# Patient Record
Sex: Female | Born: 1995 | Race: Black or African American | Hispanic: No | Marital: Single | Smoking: Current every day smoker
Health system: Southern US, Community
[De-identification: ages and names within clinical notes are randomized; demographics above are authoritative.]

## PROBLEM LIST (undated history)

## (undated) HISTORY — PX: NECK SURGERY: SHX720

---

## 2015-06-04 ENCOUNTER — Ambulatory Visit (HOSPITAL_COMMUNITY)
Admission: RE | Admit: 2015-06-04 | Discharge: 2015-06-04 | Disposition: A | Discharge status: Home / Self Care | Payer: Medicaid Other | Attending: Psychiatry | Admitting: Psychiatry

## 2015-06-04 DIAGNOSIS — F1290 Cannabis use, unspecified, uncomplicated: Secondary | ICD-10-CM | POA: Insufficient documentation

## 2015-06-04 DIAGNOSIS — F321 Major depressive disorder, single episode, moderate: Secondary | ICD-10-CM | POA: Insufficient documentation
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
## 2015-09-22 ENCOUNTER — Encounter (HOSPITAL_COMMUNITY): Payer: Self-pay | Admitting: *Deleted

## 2015-09-22 DIAGNOSIS — Y999 Unspecified external cause status: Secondary | ICD-10-CM | POA: Insufficient documentation

## 2015-09-22 DIAGNOSIS — Z9889 Other specified postprocedural states: Secondary | ICD-10-CM | POA: Insufficient documentation

## 2015-09-22 DIAGNOSIS — S161XXA Strain of muscle, fascia and tendon at neck level, initial encounter: Secondary | ICD-10-CM | POA: Insufficient documentation

## 2015-09-22 DIAGNOSIS — Y929 Unspecified place or not applicable: Secondary | ICD-10-CM | POA: Insufficient documentation

## 2015-09-22 DIAGNOSIS — Y939 Activity, unspecified: Secondary | ICD-10-CM | POA: Insufficient documentation

## 2015-09-22 DIAGNOSIS — X58XXXA Exposure to other specified factors, initial encounter: Secondary | ICD-10-CM | POA: Insufficient documentation

## 2015-09-22 DIAGNOSIS — J029 Acute pharyngitis, unspecified: Secondary | ICD-10-CM | POA: Insufficient documentation

## 2015-09-22 NOTE — ED Notes (Signed)
Patient presents with c/o neck pain - last neck surgery was in 2015.  Also c/o sore throat

## 2015-09-23 ENCOUNTER — Emergency Department (HOSPITAL_COMMUNITY)
Admission: EM | Admit: 2015-09-23 | Discharge: 2015-09-23 | Disposition: A | Discharge status: Home / Self Care | Payer: Self-pay | Attending: Emergency Medicine | Admitting: Emergency Medicine

## 2015-09-23 ENCOUNTER — Emergency Department (HOSPITAL_COMMUNITY): Payer: Self-pay

## 2015-09-23 DIAGNOSIS — S161XXA Strain of muscle, fascia and tendon at neck level, initial encounter: Secondary | ICD-10-CM

## 2015-09-23 DIAGNOSIS — J029 Acute pharyngitis, unspecified: Secondary | ICD-10-CM

## 2015-09-23 LAB — URINALYSIS, ROUTINE W REFLEX MICROSCOPIC
Bilirubin Urine: NEGATIVE
GLUCOSE, UA: NEGATIVE mg/dL
Ketones, ur: NEGATIVE mg/dL
LEUKOCYTES UA: NEGATIVE
Nitrite: NEGATIVE
PH: 6 (ref 5.0–8.0)
Protein, ur: NEGATIVE mg/dL
Specific Gravity, Urine: 1.013 (ref 1.005–1.030)

## 2015-09-23 LAB — URINE MICROSCOPIC-ADD ON: WBC, UA: NONE SEEN WBC/hpf (ref 0–5)

## 2015-09-23 LAB — RAPID STREP SCREEN (MED CTR MEBANE ONLY): Streptococcus, Group A Screen (Direct): NEGATIVE

## 2015-09-23 LAB — POC URINE PREG, ED: Preg Test, Ur: NEGATIVE

## 2015-09-23 MED ORDER — METHOCARBAMOL 500 MG PO TABS: 500.0000 mg | ORAL_TABLET | Freq: Four times a day (QID) | ORAL | Status: DC

## 2015-09-23 MED ORDER — ACETAMINOPHEN 160 MG/5ML PO LIQD: 640.0000 mg | ORAL | Status: DC | PRN

## 2015-09-23 NOTE — ED Notes (Signed)
Pt transported back to Room 8 via stretcher from xray. No signs of acute distress.

## 2015-09-23 NOTE — ED Provider Notes (Signed)
CSN: [id number]     Arrival date & time 09/22/15  2330 History   First MD Initiated Contact with Patient 09/23/15 0450     Chief Complaint  Patient presents with  . Neck Pain  . Post-op Problem     (Consider location/radiation/quality/duration/timing/severity/associated sxs/prior Treatment) HPI Comments: Patient with history of nontraumatic C-spine fracture [place] in [place] in 2014, needed repeat surgery after surgical fixation screws had broken in 2015 -- presents with complaint of 3 days of neck pain and sore throat. Neck pain began after sleeping on a hard floor. Pain is in her bilateral neck. No midline neck pain. Pain is worse with movement of her head. Patient reports having some tingling in right hand at onset but this resolved. No weakness.  Patient also has had sore throat, pain with swallowing. No fever or other URI symptoms. No known sick contacts. No difficulty breathing or handling secretions.    The history is provided by the patient.    History reviewed. No pertinent past medical history. Past Surgical History  Procedure Laterality Date  . Neck surgery     No family history on file. Social History  Substance Use Topics  . Smoking status: Never Smoker   . Smokeless tobacco: Never Used  . Alcohol Use: No   OB History    No data available     Review of Systems  Constitutional: Negative for fever, chills and fatigue.  HENT: Positive for sore throat. Negative for congestion, ear pain, rhinorrhea and sinus pressure.   Eyes: Negative for redness.  Respiratory: Negative for cough and wheezing.   Gastrointestinal: Negative for nausea, vomiting, abdominal pain and diarrhea.  Genitourinary: Negative for dysuria.  Musculoskeletal: Positive for neck pain. Negative for myalgias and neck stiffness.  Skin: Negative for rash.  Neurological: Negative for headaches.  Hematological: Negative for adenopathy.    Allergies  Banana and Eggs or egg-derived products  Home  Medications   Prior to Admission medications   Not on File   BP 107/70 mmHg  Pulse 83  Temp(Src) 98.7 F (37.1 C) (Oral)  Resp 18  Ht 5\' 2"  (1.575 m)  Wt 61.508 kg  BMI 24.80 kg/m2  SpO2 100%  LMP 08/17/2015   Physical Exam  Constitutional: She appears well-developed and well-nourished.  HENT:  Head: Normocephalic and atraumatic.  Right Ear: Tympanic membrane, external ear and ear canal normal.  Left Ear: Tympanic membrane, external ear and ear canal normal.  Nose: Nose normal. No mucosal edema or rhinorrhea.  Mouth/Throat: Uvula is midline and mucous membranes are normal. Mucous membranes are not dry. No oral lesions. No trismus in the jaw. No uvula swelling. Posterior oropharyngeal erythema present. No oropharyngeal exudate, posterior oropharyngeal edema or tonsillar abscesses.  No trismus.  Eyes: Conjunctivae are normal. Right eye exhibits no discharge. Left eye exhibits no discharge.  Neck: Normal range of motion. Neck supple.  Cardiovascular: Normal rate, regular rhythm and normal heart sounds.   Pulmonary/Chest: Effort normal and breath sounds normal. No respiratory distress. She has no wheezes. She has no rales.  Abdominal: Soft. There is no tenderness.  Musculoskeletal: She exhibits tenderness. She exhibits no edema.       Right shoulder: Normal.       Left shoulder: Normal.       Cervical back: She exhibits tenderness (Bilateral paraspinous muscular tenderness to palpation). She exhibits normal range of motion and no bony tenderness.       Thoracic back: She exhibits normal range of motion, no  tenderness and no bony tenderness.       Lumbar back: She exhibits normal range of motion, no tenderness and no bony tenderness.  Lymphadenopathy:    She has cervical adenopathy.  Neurological: She is alert.  Normal upper and lower extremity weakness.  Skin: Skin is warm and dry.  Psychiatric: She has a normal mood and affect.  Nursing note and vitals reviewed.   ED Course   Procedures (including critical care time) Labs Review Labs Reviewed  URINALYSIS, ROUTINE W REFLEX MICROSCOPIC (NOT AT [facility]) - Abnormal; Notable for the following:    APPearance CLOUDY (*)    Hgb urine dipstick MODERATE (*)    All other components within normal limits  URINE MICROSCOPIC-ADD ON - Abnormal; Notable for the following:    Squamous Epithelial / LPF 0-5 (*)    Bacteria, UA RARE (*)    All other components within normal limits  RAPID STREP SCREEN (NOT AT [facility])  CULTURE, GROUP A STREP ([facility])  POC URINE PREG, ED    Imaging Review Dg Cervical Spine Complete  09/23/2015  CLINICAL DATA:  Neck pain for 4 days after sleeping on a hard floor. History of multiple neck surgeries. EXAM: CERVICAL SPINE - COMPLETE 4+ VIEW COMPARISON:  None. FINDINGS: Postoperative changes with posterior rod and screw fixation of C1-C2. The inferior fixation screws are fractured. This could represent acute or chronic process as no prior comparison studies are available. A few segments appear intact. There appears to be complete bony fusion of C1-C2. Normal alignment of the cervical spine. No vertebral compression deformities. No prevertebral soft tissue swelling. No bone encroachment upon the neural foramina. No focal bone lesion or bone destruction. Soft tissues are unremarkable. IMPRESSION: Postoperative changes with posterior fusion and hardware fixation at C1-2. Fixation screws are fractured, age indeterminate. Bone fusion appears intact. Normal alignment of the cervical spine. No acute bony abnormalities are suggested. Electronically Signed   By: Burman Nieves M.D.   On: 09/23/2015 06:13   I have personally reviewed and evaluated these images and lab results as part of my medical decision-making.  5:30 AM Patient seen and examined. Work-up initiated.   Vital signs reviewed and are as follows: BP 107/70 mmHg  Pulse 83  Temp(Src) 98.7 F (37.1 C) (Oral)  Resp 18  Ht  (1.575 m)  Wt 61.508 kg   BMI 24.80 kg/m2  SpO2 100%  LMP 08/17/2015  7:10 AM Patient informed of x-ray results.  Discharge to home with Tylenol for sore throat and pain, Robaxin.  Patient counseled on proper use of muscle relaxant medication.  They were told not to drink alcohol, drive any vehicle, or do any dangerous activities while taking this medication.  Patient verbalized understanding.  Patient counseled on supportive care for pharyngitis and s/s to return including worsening symptoms, persistent fever, persistent vomiting, or if they have any other concerns. Urged to see PCP if symptoms persist for more than 3 days. Patient verbalizes understanding and agrees with plan.   MDM   Final diagnoses:  Pharyngitis  Cervical strain, initial encounter   Pharyngitis: Strep negative. Patient has pharyngeal erythema and mild cervical lymphadenopathy. No trismus. Full range of motion of neck. Low suspicion for deep space infection in neck. Likely viral.   Cervical strain: Patient with previous neck surgery. Previously known broken cervical fixation screws. Otherwise, hardware in place. Likely cervical strain from sleeping on the floor. No neurological deficits.   Renne Crigler, PA-C 09/23/15 0715  Gilda Crease, MD  09/24/15 0554 

## 2015-09-23 NOTE — ED Notes (Signed)
Patient transported to X-ray 

## 2015-09-23 NOTE — ED Notes (Signed)
Josh, PA at bedside to speak with pt regarding xray results and discharge instructions. Pt states understanding.

## 2015-09-23 NOTE — Discharge Instructions (Signed)
Please read and follow all provided instructions.  Your diagnoses today include:  1. Pharyngitis   2. Cervical strain, initial encounter     Tests performed today include:  Vital signs - see below for your results today  X-ray of neck - shows previously known broken screws  Strep test - negative  Medications prescribed:   Robaxin (methocarbamol) - muscle relaxer medication  DO NOT drive or perform any activities that require you to be awake and alert because this medicine can make you drowsy.   Take any prescribed medications only as directed.  Home care instructions:   Follow any educational materials contained in this packet  Please rest, use ice or heat on your neck for the next several days  Do not lift, push, pull anything more than 10 pounds for the next week  Follow-up instructions: Please follow-up with your primary care provider in the next 1 week for further evaluation of your symptoms.   Return instructions:  SEEK IMMEDIATE MEDICAL ATTENTION IF YOU HAVE:  New numbness, tingling, weakness, or problem with the use of your arms or legs  Severe back pain not relieved with medications  Loss control of your bowels or bladder  Increasing pain in any areas of the body (such as chest or abdominal pain)  Shortness of breath, dizziness, or fainting.   Worsening nausea (feeling sick to your stomach), vomiting, fever, or sweats  Trouble opening your mouth or trouble swallowing  Any other emergent concerns regarding your health   Additional Information:  Your vital signs today were: BP 120/84 mmHg   Pulse 83   Temp(Src) 98.7 F (37.1 C) (Oral)   Resp 18   Ht 5\' 2"  (1.575 m)   Wt 61.508 kg   BMI 24.80 kg/m2   SpO2 100%   LMP 08/17/2015 If your blood pressure (BP) was elevated above 135/85 this visit, please have this repeated by your doctor within one month. --------------

## 2015-09-25 LAB — CULTURE, GROUP A STREP (THRC)

## 2016-09-06 ENCOUNTER — Encounter (HOSPITAL_COMMUNITY): Payer: Self-pay | Admitting: Emergency Medicine

## 2016-09-06 ENCOUNTER — Emergency Department (HOSPITAL_COMMUNITY)
Admission: EM | Admit: 2016-09-06 | Discharge: 2016-09-06 | Disposition: A | Discharge status: Home / Self Care | Payer: Medicaid Other | Attending: Emergency Medicine | Admitting: Emergency Medicine

## 2016-09-06 DIAGNOSIS — B9689 Other specified bacterial agents as the cause of diseases classified elsewhere: Secondary | ICD-10-CM | POA: Diagnosis not present

## 2016-09-06 DIAGNOSIS — Z7982 Long term (current) use of aspirin: Secondary | ICD-10-CM | POA: Insufficient documentation

## 2016-09-06 DIAGNOSIS — R21 Rash and other nonspecific skin eruption: Secondary | ICD-10-CM | POA: Diagnosis not present

## 2016-09-06 DIAGNOSIS — N898 Other specified noninflammatory disorders of vagina: Secondary | ICD-10-CM | POA: Diagnosis present

## 2016-09-06 DIAGNOSIS — Z79899 Other long term (current) drug therapy: Secondary | ICD-10-CM | POA: Insufficient documentation

## 2016-09-06 DIAGNOSIS — N760 Acute vaginitis: Secondary | ICD-10-CM | POA: Diagnosis not present

## 2016-09-06 LAB — URINALYSIS, ROUTINE W REFLEX MICROSCOPIC
BACTERIA UA: NONE SEEN
Bilirubin Urine: NEGATIVE
Glucose, UA: NEGATIVE mg/dL
Ketones, ur: NEGATIVE mg/dL
LEUKOCYTES UA: NEGATIVE
Nitrite: NEGATIVE
PH: 6 (ref 5.0–8.0)
Protein, ur: NEGATIVE mg/dL
SPECIFIC GRAVITY, URINE: 1.01 (ref 1.005–1.030)

## 2016-09-06 LAB — WET PREP, GENITAL
SPERM: NONE SEEN
TRICH WET PREP: NONE SEEN
YEAST WET PREP: NONE SEEN

## 2016-09-06 MED ORDER — METRONIDAZOLE 500 MG PO TABS: 500.0000 mg | ORAL_TABLET | Freq: Two times a day (BID) | ORAL | 0 refills | Status: DC

## 2016-09-06 MED ORDER — CLOTRIMAZOLE-BETAMETHASONE 1-0.05 % EX CREA: TOPICAL_CREAM | CUTANEOUS | 0 refills | Status: DC

## 2016-09-06 MED ORDER — STERILE WATER FOR INJECTION IJ SOLN
INTRAMUSCULAR | Status: AC
  Filled 2016-09-06: qty 10

## 2016-09-06 MED ORDER — CEFTRIAXONE SODIUM 250 MG IJ SOLR
250.0000 mg | Freq: Once | INTRAMUSCULAR | Status: AC
  Administered 2016-09-06: 250 mg via INTRAMUSCULAR
  Filled 2016-09-06: qty 250

## 2016-09-06 MED ORDER — AZITHROMYCIN 250 MG PO TABS
1000.0000 mg | ORAL_TABLET | Freq: Once | ORAL | Status: AC
  Administered 2016-09-06: 1000 mg via ORAL
  Filled 2016-09-06: qty 4

## 2016-09-06 NOTE — Discharge Instructions (Signed)
Please take all of your antibiotics until finished! Ibuprofen as needed for pain. Use a condom with every sexual encounter Follow up with the OBGYN clinic listed in regards to today's visit. Please return to the ER for worsening symptoms, high fevers or persistent vomiting.  You have been tested for HIV, syphilis, chlamydia and gonorrhea. These results will be available in approximately 3 days. You will be notified if they are positive.   SEEK IMMEDIATE MEDICAL CARE IF:  You develop an oral temperature above 102 F (38.9 C), not controlled by medications or lasting more than 2 days.  You develop an increase in pain.  You develop vaginal bleeding and it is not time for your period.  You develop painful intercourse.

## 2016-09-06 NOTE — ED Notes (Signed)
With screening questions regarding SI pt verbalizes "at times" related to being overwhelmed; pt denies SI or plan at present time. Pt denies ever acting on SI in the past. When questioning pt if would like to be evaluated for SI pt denies and admittedly denies SI or evaluation for such complaint.

## 2016-09-06 NOTE — ED Notes (Signed)
Bed: [facility]1 Expected date:  Expected time:  Means of arrival:  Comments: 

## 2016-09-06 NOTE — ED Triage Notes (Signed)
Pt complaint of acute chronic lower back pain worsening for a few months. Pt continues to verbalize pelvic pain for a few days. Pt continues to verbalize generalized rash worsening over past few months. Pt continues to verbalize Implanon past due 2 years to be removed; denies symptoms related to Implanon. Pt denies n/v/d, GU symptoms, or vaginal odor/itching/abnormal discharge.

## 2016-09-06 NOTE — ED Provider Notes (Signed)
[facility]-EMERGENCY DEPT Provider Note   CSN: [id number] Arrival date & time: 09/06/16  [id number]     History   Chief Complaint Chief Complaint  Patient presents with  . Multiple Complaints    HPI Alexandra BalesVictoria Hughes is a 21 y.o. female.  The history is provided by the patient and medical records. No language interpreter was used.     Alexandra BalesVictoria Hughes is a 21 y.o. female who presents to the Emergency Department with multiple complaints:  1. Worsening pelvic pain x 4-5 days described as cramping and associated with vaginal discharge. Pain comes and goes. No dysuria or urinary urgency/frequency. No medications taken prior to arrival for symptoms. She denies any recent sexual activity in the last 6 months.   2. She would also like to be evaluated for her chronic back pain. She states this occurs daily and is constant for the last several months. It is worse with movement and lifting her young child. No acute change in her back pain. She has tried Tylenol with little relief.  3. Rash to bilateral legs which has been coming and going for several months as well. Itches. She has tried topical OTC itch cream. Hx of eczema but this seems different. Rash not present now.   4. Triage nursing staff made me aware that patient had answered "at times" to suicidal ideations, however to plan. When I asked patient if she felt suicidal, she says that she has been in the past, but not currently. She sees a Veterinary surgeoncounselor and follows up with them regularly. Never has had a plan for harming herself. No HI or auditory/visual hallucinations. Family and friends with her in the room and appear very supportive. No prior suicide attempts. No prior psychiatric illness diagnosis. No mental health meds.   History reviewed. No pertinent past medical history.  There are no active problems to display for this patient.   Past Surgical History:  Procedure Laterality Date  . NECK SURGERY      OB History    No data available        Home Medications    Prior to Admission medications   Medication Sig Start Date End Date Taking? Authorizing Provider  acetaminophen (TYLENOL) 160 MG/5ML liquid Take 20 mLs (640 mg total) by mouth every 4 (four) hours as needed for fever. 09/23/15  Yes Renne CriglerGeiple, Joshua, PA-C  Aspirin-Caffeine (BACK & BODY EXTRA STRENGTH) 500-32.5 MG TABS Take 2 tablets by mouth every 6 (six) hours as needed (pain).   Yes [provider]  traMADol (ULTRAM) 50 MG tablet Take 50 mg by mouth every 6 (six) hours as needed.   Yes [provider]  clotrimazole-betamethasone (LOTRISONE) cream Apply to affected area 2 times daily prn 09/06/16   Alisea Matte, Chase PicketJaime Pilcher, PA-C  methocarbamol (ROBAXIN) 500 MG tablet Take 1 tablet (500 mg total) by mouth 4 (four) times daily. Patient not taking: Reported on 09/06/2016 09/23/15   Renne CriglerGeiple, Joshua, PA-C  metroNIDAZOLE (FLAGYL) 500 MG tablet Take 1 tablet (500 mg total) by mouth 2 (two) times daily. 09/06/16   Edrian Melucci, Chase PicketJaime Pilcher, PA-C    Family History No family history on file.  Social History Social History  Substance Use Topics  . Smoking status: Never Smoker  . Smokeless tobacco: Never Used  . Alcohol use No     Allergies   Other; Banana; and Eggs or egg-derived products   Review of Systems Review of Systems  Gastrointestinal: Negative for abdominal pain, diarrhea, nausea and vomiting.  Genitourinary: Positive for  pelvic pain and vaginal discharge. Negative for dysuria.  Musculoskeletal: Positive for back pain. Negative for neck pain.  Skin: Positive for rash.  Psychiatric/Behavioral: Negative for agitation, self-injury and suicidal ideas. The patient is not nervous/anxious.   All other systems reviewed and are negative.    Physical Exam Updated Vital Signs BP (!) 150/124 (BP Location: Left Arm)   Pulse 75   Temp 98.1 F (36.7 C) (Oral)   Resp 17   Ht 5\' 2"  (1.575 m)   Wt 61.7 kg (136 lb)   LMP 08/10/2016   SpO2 97%   BMI 24.87  kg/m   Physical Exam  Constitutional: She is oriented to person, place, and time. She appears well-developed and well-nourished. No distress.  HENT:  Head: Normocephalic and atraumatic.  Cardiovascular: Normal rate, regular rhythm and normal heart sounds.   No murmur heard. Pulmonary/Chest: Effort normal and breath sounds normal. No respiratory distress.  Abdominal: Soft. Bowel sounds are normal. She exhibits no distension. There is no tenderness.  Genitourinary:  Genitourinary Comments: Chaperone present for exam. + discharge. No CMT. No adnexal masses, tenderness, or fullness.  No bleeding within vaginal vault  Musculoskeletal:  No midline C/T/L-spine tenderness. Full range of motion of the back. Straight leg raises are negative bilaterally. Able to ambulate with steady gait and without discomfort.  Neurological: She is alert and oriented to person, place, and time.  Bilateral lower extremities neurovascularly intact.  Skin: Skin is warm and dry.  No rash.  Nursing note and vitals reviewed.    ED Treatments / Results  Labs (all labs ordered are listed, but only abnormal results are displayed) Labs Reviewed  WET PREP, GENITAL - Abnormal; Notable for the following:       Result Value   Clue Cells Wet Prep HPF POC PRESENT (*)    WBC, Wet Prep HPF POC MANY (*)    All other components within normal limits  URINALYSIS, ROUTINE W REFLEX MICROSCOPIC - Abnormal; Notable for the following:    Hgb urine dipstick SMALL (*)    Squamous Epithelial / LPF 0-5 (*)    All other components within normal limits  RPR  HIV ANTIBODY (ROUTINE TESTING)  GC/CHLAMYDIA PROBE AMP ([place]) NOT AT [facility]    EKG  EKG Interpretation None       Radiology No results found.  Procedures Procedures (including critical care time)  Medications Ordered in ED Medications  sterile water (preservative free) injection (not administered)  cefTRIAXone (ROCEPHIN) injection 250 mg (250 mg Intramuscular  Given 09/06/16 1126)  azithromycin (ZITHROMAX) tablet 1,000 mg (1,000 mg Oral Given 09/06/16 1126)     Initial Impression / Assessment and Plan / ED Course  I have reviewed the triage vital signs and the nursing notes.  Pertinent labs & imaging results that were available during my care of the patient were reviewed by me and considered in my medical decision making (see chart for details).    Alexandra Hughes is a 21 y.o. female who presents to ED for Multiple complaints:  1. Pelvic pain and vaginal discharge. Urine negative. Wet prep with clue cells and many WBCs. No cervical or adnexal tenderness on exam. Afebrile. Prophylactically treated with azithromycin and Rocephin. Rx for Flagyl given. OB/GYN follow-up recommended and referral information provided.  2. Chronic low back pain. No red flag symptoms of back pain. No midline tenderness. Lower extremities neurovascularly intact. No concern for cauda equina. No infectious symptoms to suggest infectious etiology and. PCP follow-up. Tylenol or ibuprofen  as needed.  3. Intermittent rash. Currently not present. PCP follow-up.  4. When asked if she was suicidal in triage, patient stated "at times". When I questioned patient about SI, she states that she has Been suicidal in the past, but not currently. She has seen a counselor in the past and follows up with them regularly. She states that she has her phone number and calls frequently if she ever feels unsafe. She does not have any homicidal ideations or auditory/visual hallucinations. She never has had a suicide attempt in the past nor had a solid plan for harming herself. She is not on any mental health medications and has no prior psychiatric illness diagnosis. Her family and friends in the room and appeared very supportive. They are aware of occasional suicidal ideations and is in agreement that patient does indeed have follow-up care whom they trust. Prior to discharge, I spoke with patient privately  without family or friends in the room. She again denies any current suicidal thoughts. Offered TTS if she would like to speak to someone, but she again states that she can call her therapist if she needs anything. She understands that she can always return to ER if she has these thoughts and I encouraged her to do so. Other reasons to return to ER for problems #1-#3 were discussed and all questions answered.   Final Clinical Impressions(s) / ED Diagnoses   Final diagnoses:  Vaginal discharge  BV (bacterial vaginosis)  Rash    New Prescriptions Discharge Medication List as of 09/06/2016 11:24 AM    START taking these medications   Details  clotrimazole-betamethasone (LOTRISONE) cream Apply to affected area 2 times daily prn, Print    metroNIDAZOLE (FLAGYL) 500 MG tablet Take 1 tablet (500 mg total) by mouth 2 (two) times daily., Starting Mon 09/06/2016, Print         Alexandra Hughes, Chase Picket, PA-C 09/06/16 1241    Shaune Pollack, MD 09/07/16 [phone]

## 2016-09-06 NOTE — ED Notes (Signed)
ED Provider at bedside. 

## 2016-09-07 LAB — GC/CHLAMYDIA PROBE AMP (~~LOC~~) NOT AT ARMC
Chlamydia: NEGATIVE
Neisseria Gonorrhea: NEGATIVE

## 2016-09-07 LAB — HIV ANTIBODY (ROUTINE TESTING W REFLEX): HIV Screen 4th Generation wRfx: NONREACTIVE

## 2016-09-07 LAB — RPR: RPR Ser Ql: NONREACTIVE

## 2017-08-09 ENCOUNTER — Emergency Department (HOSPITAL_COMMUNITY): Payer: Medicaid Other

## 2017-08-09 ENCOUNTER — Emergency Department (HOSPITAL_COMMUNITY)
Admission: EM | Admit: 2017-08-09 | Discharge: 2017-08-09 | Disposition: A | Discharge status: Home / Self Care | Payer: Medicaid Other | Attending: Emergency Medicine | Admitting: Emergency Medicine

## 2017-08-09 ENCOUNTER — Encounter (HOSPITAL_COMMUNITY): Payer: Self-pay | Admitting: Emergency Medicine

## 2017-08-09 DIAGNOSIS — Z79899 Other long term (current) drug therapy: Secondary | ICD-10-CM | POA: Diagnosis not present

## 2017-08-09 DIAGNOSIS — Y999 Unspecified external cause status: Secondary | ICD-10-CM | POA: Diagnosis not present

## 2017-08-09 DIAGNOSIS — F17290 Nicotine dependence, other tobacco product, uncomplicated: Secondary | ICD-10-CM | POA: Diagnosis not present

## 2017-08-09 DIAGNOSIS — S0083XA Contusion of other part of head, initial encounter: Secondary | ICD-10-CM | POA: Diagnosis not present

## 2017-08-09 DIAGNOSIS — Y939 Activity, unspecified: Secondary | ICD-10-CM | POA: Diagnosis not present

## 2017-08-09 DIAGNOSIS — S80212A Abrasion, left knee, initial encounter: Secondary | ICD-10-CM | POA: Diagnosis not present

## 2017-08-09 DIAGNOSIS — Y929 Unspecified place or not applicable: Secondary | ICD-10-CM | POA: Insufficient documentation

## 2017-08-09 DIAGNOSIS — T07XXXA Unspecified multiple injuries, initial encounter: Secondary | ICD-10-CM

## 2017-08-09 DIAGNOSIS — S0990XA Unspecified injury of head, initial encounter: Secondary | ICD-10-CM | POA: Diagnosis present

## 2017-08-09 LAB — POC URINE PREG, ED: PREG TEST UR: NEGATIVE

## 2017-08-09 MED ORDER — NAPROXEN 375 MG PO TABS: 375.0000 mg | ORAL_TABLET | Freq: Two times a day (BID) | ORAL | 0 refills | Status: DC

## 2017-08-09 MED ORDER — CYCLOBENZAPRINE HCL 10 MG PO TABS: 10.0000 mg | ORAL_TABLET | Freq: Two times a day (BID) | ORAL | 0 refills | Status: DC | PRN

## 2017-08-09 NOTE — ED Notes (Signed)
Bed: WTR6 Expected date:  Expected time:  Means of arrival:  Comments: 
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
## 2017-08-09 NOTE — Discharge Instructions (Addendum)
Do not drive while taking the muscle relaxer as it will make you sleepy. °

## 2017-11-09 ENCOUNTER — Other Ambulatory Visit: Payer: Self-pay

## 2017-11-09 ENCOUNTER — Encounter (HOSPITAL_BASED_OUTPATIENT_CLINIC_OR_DEPARTMENT_OTHER): Payer: Self-pay | Admitting: Emergency Medicine

## 2017-11-09 ENCOUNTER — Emergency Department (HOSPITAL_BASED_OUTPATIENT_CLINIC_OR_DEPARTMENT_OTHER)
Admission: EM | Admit: 2017-11-09 | Discharge: 2017-11-09 | Disposition: A | Discharge status: Home / Self Care | Payer: Medicaid Other | Attending: Emergency Medicine | Admitting: Emergency Medicine

## 2017-11-09 DIAGNOSIS — N760 Acute vaginitis: Secondary | ICD-10-CM | POA: Insufficient documentation

## 2017-11-09 DIAGNOSIS — F17200 Nicotine dependence, unspecified, uncomplicated: Secondary | ICD-10-CM | POA: Diagnosis not present

## 2017-11-09 DIAGNOSIS — Z202 Contact with and (suspected) exposure to infections with a predominantly sexual mode of transmission: Secondary | ICD-10-CM | POA: Diagnosis not present

## 2017-11-09 DIAGNOSIS — M7918 Myalgia, other site: Secondary | ICD-10-CM

## 2017-11-09 DIAGNOSIS — B9689 Other specified bacterial agents as the cause of diseases classified elsewhere: Secondary | ICD-10-CM

## 2017-11-09 LAB — URINALYSIS, ROUTINE W REFLEX MICROSCOPIC
BILIRUBIN URINE: NEGATIVE
GLUCOSE, UA: NEGATIVE mg/dL
KETONES UR: NEGATIVE mg/dL
Leukocytes, UA: NEGATIVE
Nitrite: NEGATIVE
PROTEIN: NEGATIVE mg/dL
Specific Gravity, Urine: 1.015 (ref 1.005–1.030)
pH: 6.5 (ref 5.0–8.0)

## 2017-11-09 LAB — PREGNANCY, URINE: PREG TEST UR: NEGATIVE

## 2017-11-09 LAB — WET PREP, GENITAL
Sperm: NONE SEEN
TRICH WET PREP: NONE SEEN
Yeast Wet Prep HPF POC: NONE SEEN

## 2017-11-09 LAB — URINALYSIS, MICROSCOPIC (REFLEX): WBC, UA: NONE SEEN WBC/hpf (ref 0–5)

## 2017-11-09 MED ORDER — METHOCARBAMOL 500 MG PO TABS: 500.0000 mg | ORAL_TABLET | Freq: Two times a day (BID) | ORAL | 0 refills | Status: DC

## 2017-11-09 MED ORDER — IBUPROFEN 400 MG PO TABS: 400.0000 mg | ORAL_TABLET | Freq: Four times a day (QID) | ORAL | 0 refills | Status: DC | PRN

## 2017-11-09 MED ORDER — CEFTRIAXONE SODIUM 250 MG IJ SOLR
250.0000 mg | Freq: Once | INTRAMUSCULAR | Status: AC
  Administered 2017-11-09: 250 mg via INTRAMUSCULAR
  Filled 2017-11-09: qty 250

## 2017-11-09 MED ORDER — METRONIDAZOLE 500 MG PO TABS: 500.0000 mg | ORAL_TABLET | Freq: Two times a day (BID) | ORAL | 0 refills | Status: DC

## 2017-11-09 MED ORDER — AZITHROMYCIN 250 MG PO TABS
1000.0000 mg | ORAL_TABLET | Freq: Once | ORAL | Status: AC
  Administered 2017-11-09: 1000 mg via ORAL
  Filled 2017-11-09: qty 4

## 2017-11-09 MED ORDER — LIDOCAINE HCL (PF) 1 % IJ SOLN
INTRAMUSCULAR | Status: AC
  Administered 2017-11-09: 2.5 mL
  Filled 2017-11-09: qty 5
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
## 2017-11-09 NOTE — Discharge Instructions (Addendum)
Please follow-up with your primary care provider for further evaluation regarding your visit today. Please return to the emergency department for any new or worsening symptoms. Please take all of your antibiotic medication, Flagyl, as prescribed.  Please do not drink alcohol while taking this medication, it will make you vomit. You may take the muscle relaxer, Robaxin, as prescribed.  Please do not drive or operate heavy machinery while taking this medication because it would make you drowsy. You may also take the anti-inflammatory medication, ibuprofen, as prescribed.  Please be sure to drink plenty of water to avoid dehydration.  Contact a health care provider if: Your muscle pain gets worse and medicines do not help. You have muscle pain that lasts longer than 3 days. You have a rash or fever along with muscle pain. You have muscle pain after a tick bite. You have muscle pain while working out, even though you are in good physical condition. You have redness, soreness, or swelling along with muscle pain. You have muscle pain after starting a new medicine or changing the dose of a medicine. Get help right away if: You have trouble breathing. You have trouble swallowing. You have muscle pain along with a stiff neck, fever, and vomiting. You have severe muscle weakness or cannot move part of your body. Contact a health care provider if: See your health care provider. Tell your sexual partner(s). They should be tested and treated for any STDs. Do not have sex until your health care provider says it is okay. Get help right away if: You have severe abdominal pain. You are a man and notice swelling or pain in your testicles. You are a woman and notice swelling or pain in your vagina.

## 2017-11-10 LAB — RPR: RPR: NONREACTIVE

## 2017-11-10 LAB — HIV ANTIBODY (ROUTINE TESTING W REFLEX): HIV SCREEN 4TH GENERATION: NONREACTIVE

## 2017-11-11 LAB — GC/CHLAMYDIA PROBE AMP (~~LOC~~) NOT AT ARMC
Chlamydia: NEGATIVE
NEISSERIA GONORRHEA: NEGATIVE

## 2018-03-06 ENCOUNTER — Emergency Department (HOSPITAL_COMMUNITY): Payer: Medicaid Other

## 2018-03-06 ENCOUNTER — Other Ambulatory Visit: Payer: Self-pay

## 2018-03-06 ENCOUNTER — Emergency Department (HOSPITAL_COMMUNITY)
Admission: EM | Admit: 2018-03-06 | Discharge: 2018-03-06 | Disposition: A | Discharge status: Home / Self Care | Payer: Medicaid Other | Attending: Emergency Medicine | Admitting: Emergency Medicine

## 2018-03-06 DIAGNOSIS — Z79899 Other long term (current) drug therapy: Secondary | ICD-10-CM | POA: Insufficient documentation

## 2018-03-06 DIAGNOSIS — F17290 Nicotine dependence, other tobacco product, uncomplicated: Secondary | ICD-10-CM | POA: Insufficient documentation

## 2018-03-06 DIAGNOSIS — M542 Cervicalgia: Secondary | ICD-10-CM | POA: Insufficient documentation

## 2018-03-06 MED ORDER — DIAZEPAM 5 MG PO TABS: 5.0000 mg | ORAL_TABLET | Freq: Four times a day (QID) | ORAL | 0 refills | Status: DC | PRN

## 2018-03-06 MED ORDER — KETOROLAC TROMETHAMINE 60 MG/2ML IM SOLN
60.0000 mg | Freq: Once | INTRAMUSCULAR | Status: AC
  Administered 2018-03-06: 60 mg via INTRAMUSCULAR
  Filled 2018-03-06: qty 2

## 2018-03-06 MED ORDER — DIAZEPAM 2 MG PO TABS
2.0000 mg | ORAL_TABLET | Freq: Once | ORAL | Status: DC
  Filled 2018-03-06: qty 1

## 2018-03-06 MED ORDER — IBUPROFEN 800 MG PO TABS: 800.0000 mg | ORAL_TABLET | Freq: Three times a day (TID) | ORAL | 0 refills | Status: DC

## 2018-03-06 NOTE — ED Provider Notes (Signed)
[facility]-EMERGENCY DEPT Provider Note   CSN: [id number] Arrival date & time: 03/06/18  0751     History   Chief Complaint Chief Complaint  Patient presents with  . Neck Pain    HPI Alexandra Hughes is a 22 y.o. female.  The history is provided by the patient.  Neck Pain   This is a recurrent problem. The current episode started more than 2 days ago. The problem occurs constantly. The problem has been gradually worsening. The pain is associated with nothing. There has been no fever. The pain is present in the left side and right side. The pain is moderate. The symptoms are aggravated by twisting, bending and position.    No past medical history on file.  There are no active problems to display for this patient.   Past Surgical History:  Procedure Laterality Date  . NECK SURGERY       OB History   None      Home Medications    Prior to Admission medications   Medication Sig Start Date End Date Taking? Authorizing Provider  acetaminophen (TYLENOL) 160 MG/5ML liquid Take 20 mLs (640 mg total) by mouth every 4 (four) hours as needed for fever. 09/23/15   Renne CriglerGeiple, Joshua, PA-C  Aspirin-Caffeine (BACK & BODY EXTRA STRENGTH) 500-32.5 MG TABS Take 2 tablets by mouth every 6 (six) hours as needed (pain).    [provider]  cyclobenzaprine (FLEXERIL) 10 MG tablet Take 1 tablet (10 mg total) by mouth 2 (two) times daily as needed for muscle spasms. 08/09/17   Janne NapoleonNeese, Hope M, NP  diazepam (VALIUM) 5 MG tablet Take 1 tablet (5 mg total) by mouth every 6 (six) hours as needed for anxiety (spasms). 03/06/18   Skyy Mcknight, Barbara CowerJason, MD  ibuprofen (ADVIL,MOTRIN) 800 MG tablet Take 1 tablet (800 mg total) by mouth 3 (three) times daily. 03/06/18   Adelbert Gaspard, Barbara CowerJason, MD  methocarbamol (ROBAXIN) 500 MG tablet Take 1 tablet (500 mg total) by mouth 2 (two) times daily. 11/09/17   Harlene SaltsMorelli, Brandon A, PA-C  metroNIDAZOLE (FLAGYL) 500 MG tablet Take 1 tablet (500 mg total) by  mouth 2 (two) times daily. 11/09/17   Harlene SaltsMorelli, Brandon A, PA-C  naproxen (NAPROSYN) 375 MG tablet Take 1 tablet (375 mg total) by mouth 2 (two) times daily. 08/09/17   Janne NapoleonNeese, Hope M, NP    Family History No family history on file.  Social History Social History   Tobacco Use  . Smoking status: Current Every Day Smoker    Types: Cigars  . Smokeless tobacco: Never Used  Substance Use Topics  . Alcohol use: No  . Drug use: Yes    Frequency: 1.0 times per week    Types: Marijuana     Allergies   Other; Banana; and Eggs or egg-derived products   Review of Systems Review of Systems  Musculoskeletal: Positive for neck pain.  All other systems reviewed and are negative.    Physical Exam Updated Vital Signs BP 121/72 (BP Location: Right Arm)   Pulse 74   Temp 98.2 F (36.8 C) (Oral)   Resp 20   Ht 5\' 2"  (1.575 m)   Wt 73.1 kg   LMP 01/25/2018 (Approximate)   SpO2 100%   BMI 29.49 kg/m   Physical Exam  Constitutional: She is oriented to person, place, and time. She appears well-developed and well-nourished.  HENT:  Head: Normocephalic and atraumatic.  Eyes: Conjunctivae and EOM are normal.  Neck: Normal range of motion.  Cardiovascular: Normal rate and regular rhythm.  Pulmonary/Chest: No stridor. No respiratory distress.  Abdominal: Soft. Bowel sounds are normal. She exhibits no distension.  Musculoskeletal: Normal range of motion. She exhibits tenderness (to bilateral trapezius in neck and upper back).  Neurological: She is alert and oriented to person, place, and time. She displays normal reflexes. No cranial nerve deficit. Coordination normal.  Skin: Skin is warm and dry.  Nursing note and vitals reviewed.    ED Treatments / Results  Labs (all labs ordered are listed, but only abnormal results are displayed) Labs Reviewed - No data to display  EKG None  Radiology Dg Cervical Spine Complete  Result Date: 03/06/2018 CLINICAL DATA:  Posterior neck pain.  EXAM: CERVICAL SPINE - COMPLETE 4+ VIEW COMPARISON:  08/09/2017 FINDINGS: Posterior fusion changes at C1 tube. The C2 screws are fractured, stable since prior study. No acute bony abnormality. Normal alignment. Disc spaces maintained. IMPRESSION: Posterior fusion changes at C1-2. Fractured C2 screws, stable. No acute bony abnormality. Electronically Signed   By: Charlett Nose M.D.   On: 03/06/2018 09:08    Procedures Procedures (including critical care time)  Medications Ordered in ED Medications  diazepam (VALIUM) tablet 2 mg (0 mg Oral Hold 03/06/18 0905)  ketorolac (TORADOL) injection 60 mg (60 mg Intramuscular Given 03/06/18 0848)     Initial Impression / Assessment and Plan / ED Course  I have reviewed the triage vital signs and the nursing notes.  Pertinent labs & imaging results that were available during my care of the patient were reviewed by me and considered in my medical decision making (see chart for details).     Patient with likely trapezius sprain.  X-ray done per patient request showed stability of cervical spine and known broken screws.  Patient will give prescription for muscle relaxers and encouraged to follow-up with her neck surgeon.  Final Clinical Impressions(s) / ED Diagnoses   Final diagnoses:  Neck pain    ED Discharge Orders         Ordered    diazepam (VALIUM) 5 MG tablet  Every 6 hours PRN     03/06/18 0928    ibuprofen (ADVIL,MOTRIN) 800 MG tablet  3 times daily     03/06/18 0928           Panda Crossin, Barbara Cower, MD 03/06/18 [id number]

## 2018-03-06 NOTE — ED Triage Notes (Signed)
Patient c/o bilateral and posterior neck pain x 3 days which has gotten progressively worse. Patient denies any injury. MAE.

## 2018-11-05 ENCOUNTER — Emergency Department (HOSPITAL_COMMUNITY)
Admission: EM | Admit: 2018-11-05 | Discharge: 2018-11-05 | Disposition: A | Discharge status: Home / Self Care | Payer: Medicaid Other | Attending: Emergency Medicine | Admitting: Emergency Medicine

## 2018-11-05 ENCOUNTER — Other Ambulatory Visit: Payer: Self-pay

## 2018-11-05 ENCOUNTER — Encounter (HOSPITAL_COMMUNITY): Payer: Self-pay

## 2018-11-05 ENCOUNTER — Emergency Department (HOSPITAL_COMMUNITY): Payer: Medicaid Other

## 2018-11-05 DIAGNOSIS — E282 Polycystic ovarian syndrome: Secondary | ICD-10-CM | POA: Diagnosis not present

## 2018-11-05 DIAGNOSIS — B379 Candidiasis, unspecified: Secondary | ICD-10-CM

## 2018-11-05 DIAGNOSIS — F17210 Nicotine dependence, cigarettes, uncomplicated: Secondary | ICD-10-CM | POA: Insufficient documentation

## 2018-11-05 DIAGNOSIS — R1030 Lower abdominal pain, unspecified: Secondary | ICD-10-CM | POA: Diagnosis present

## 2018-11-05 DIAGNOSIS — R102 Pelvic and perineal pain: Secondary | ICD-10-CM | POA: Diagnosis not present

## 2018-11-05 DIAGNOSIS — N760 Acute vaginitis: Secondary | ICD-10-CM | POA: Diagnosis not present

## 2018-11-05 DIAGNOSIS — B9689 Other specified bacterial agents as the cause of diseases classified elsewhere: Secondary | ICD-10-CM

## 2018-11-05 LAB — COMPREHENSIVE METABOLIC PANEL
ALT: 22 U/L (ref 0–44)
AST: 22 U/L (ref 15–41)
Albumin: 4 g/dL (ref 3.5–5.0)
Alkaline Phosphatase: 63 U/L (ref 38–126)
Anion gap: 8 (ref 5–15)
BUN: 10 mg/dL (ref 6–20)
CO2: 25 mmol/L (ref 22–32)
Calcium: 8.9 mg/dL (ref 8.9–10.3)
Chloride: 107 mmol/L (ref 98–111)
Creatinine, Ser: 0.65 mg/dL (ref 0.44–1.00)
GFR calc Af Amer: >60 mL/min (ref >60)
GFR calc non Af Amer: >60 mL/min (ref >60)
Glucose, Bld: 93 mg/dL (ref 70–99)
Potassium: 4 mmol/L (ref 3.5–5.1)
Sodium: 140 mmol/L (ref 135–145)
Total Bilirubin: 0.3 mg/dL (ref 0.3–1.2)
Total Protein: 7.6 g/dL (ref 6.5–8.1)

## 2018-11-05 LAB — URINALYSIS, ROUTINE W REFLEX MICROSCOPIC
Bilirubin Urine: NEGATIVE
Glucose, UA: NEGATIVE mg/dL
Hgb urine dipstick: NEGATIVE
Ketones, ur: NEGATIVE mg/dL
Leukocytes,Ua: NEGATIVE
Nitrite: NEGATIVE
Protein, ur: NEGATIVE mg/dL
Specific Gravity, Urine: 1.024 (ref 1.005–1.030)
pH: 6 (ref 5.0–8.0)

## 2018-11-05 LAB — CBC WITH DIFFERENTIAL/PLATELET
Abs Immature Granulocytes: 0.05 10*3/uL (ref 0.00–0.07)
Basophils Absolute: 0 10*3/uL (ref 0.0–0.1)
Basophils Relative: 0 %
Eosinophils Absolute: 0.2 10*3/uL (ref 0.0–0.5)
Eosinophils Relative: 2 %
HCT: 38 % (ref 36.0–46.0)
Hemoglobin: 12 g/dL (ref 12.0–15.0)
Immature Granulocytes: 1 %
Lymphocytes Relative: 19 %
Lymphs Abs: 2.1 10*3/uL (ref 0.7–4.0)
MCH: 29.3 pg (ref 26.0–34.0)
MCHC: 31.6 g/dL (ref 30.0–36.0)
MCV: 92.9 fL (ref 80.0–100.0)
Monocytes Absolute: 0.6 10*3/uL (ref 0.1–1.0)
Monocytes Relative: 6 %
Neutro Abs: 8 10*3/uL — ABNORMAL HIGH (ref 1.7–7.7)
Neutrophils Relative %: 72 %
Platelets: 235 10*3/uL (ref 150–400)
RBC: 4.09 MIL/uL (ref 3.87–5.11)
RDW: 13.7 % (ref 11.5–15.5)
WBC: 11 10*3/uL — ABNORMAL HIGH (ref 4.0–10.5)
nRBC: 0 % (ref 0.0–0.2)

## 2018-11-05 LAB — LIPASE, BLOOD: Lipase: 31 U/L (ref 11–51)

## 2018-11-05 LAB — PREGNANCY, URINE: Preg Test, Ur: NEGATIVE

## 2018-11-05 LAB — HIV ANTIBODY (ROUTINE TESTING W REFLEX): HIV Screen 4th Generation wRfx: NONREACTIVE

## 2018-11-05 LAB — WET PREP, GENITAL
Sperm: NONE SEEN
Trich, Wet Prep: NONE SEEN

## 2018-11-05 LAB — RPR: RPR Ser Ql: NONREACTIVE

## 2018-11-05 MED ORDER — FLUCONAZOLE 150 MG PO TABS: 150.0000 mg | ORAL_TABLET | Freq: Once | ORAL | 0 refills | Status: AC

## 2018-11-05 MED ORDER — METRONIDAZOLE 500 MG PO TABS: 500.0000 mg | ORAL_TABLET | Freq: Two times a day (BID) | ORAL | 0 refills | Status: DC

## 2018-11-05 NOTE — Discharge Instructions (Addendum)
Please take Ibuprofen (Advil, motrin) and Tylenol (acetaminophen) to relieve your pain.  You may take up to 600 MG (3 pills) of normal strength ibuprofen every 8 hours as needed.  In between doses of ibuprofen you make take tylenol, up to 1,000 mg (two extra strength pills).  Do not take more than 3,000 mg tylenol in a 24 hour period.  Please check all medication labels as many medications such as pain and cold medications may contain tylenol.  Do not drink alcohol while taking these medications.  Do not take other NSAID'S while taking ibuprofen (such as aleve or naproxen).  Please take ibuprofen with food to decrease stomach upset.  Please do not have any sexual contact for 2 weeks.  You may have diarrhea from the antibiotics.  It is very important that you continue to take the antibiotics even if you get diarrhea unless a medical professional tells you that you may stop taking them.  If you stop too early the bacteria you are being treated for will become stronger and you may need different, more powerful antibiotics that have more side effects and worsening diarrhea.  Please stay well hydrated and consider probiotics as they may decrease the severity of your diarrhea.  Please be aware that if you take any hormonal contraception (birth control pills, nexplanon, the ring, etc) that your birth control will not work while you are taking antibiotics and you need to use back up protection as directed on the birth control medication information insert.   Today your diagnosed with bacterial vaginosis and received a prescription for metronidazole also known as Flagyl. It is very important that you do not consume any alcohol while taking this medication as it will cause you to become violently ill.

## 2018-11-05 NOTE — ED Triage Notes (Signed)
Pt complains of lower abdominal pain for three weeks, she states that it usually happens around her period and then it goes away but this month the pain stayed and got worse

## 2018-11-05 NOTE — ED Provider Notes (Signed)
[facility]-EMERGENCY DEPT Provider Note   CSN: [id number] Arrival date & time: 11/05/18  [id number]    History   Chief Complaint Chief Complaint  Patient presents with   Abdominal Pain    HPI Alexandra Hughes is a 23 y.o. female who presents today for evaluation of lower abdominal pain for approximately 3 weeks.  She reports that she had her menstrual cycle which lasted a week as per normal and once the cycle stopped about 2-1/2 weeks ago her pain stayed.  She states that her pain is never stayed after a menstrual cycle like this before.  Initially her pain was only at night however is now constant.  She states that her entire life when she gets her menstrual cycles she feels the pain in her hips bilaterally and that is the same location of this pain.  She denies any nausea, vomiting, or diarrhea.  She states that she only has female sexual partners and that her partner had been checked for gonorrhea, chlamydia, HIV, and syphilis.  She reports increased urinary urgency, denies frequency or dysuria.  She reports that she is having increased vaginal discharge.  She reports that [facility] drew blood and checked urine, however those have not been resulted yet.  She tried Tylenol 1 g 3 times a day at home and that has not helped with her pain.  She did try Excedrin headache with moderate relief.      HPI  History reviewed. No pertinent past medical history.  Patient Active Problem List   Diagnosis Date Noted   PCOS (polycystic ovarian syndrome) 11/05/2018    Past Surgical History:  Procedure Laterality Date   NECK SURGERY       OB History   No obstetric history on file.      Home Medications    Prior to Admission medications   Medication Sig Start Date End Date Taking? Authorizing Provider  aspirin-acetaminophen-caffeine (EXCEDRIN MIGRAINE) [id number] MG tablet Take 2 tablets by mouth every 6 (six) hours as  needed for headache.   Yes [provider]  traMADol (ULTRAM) 50 MG tablet Take 50 mg by mouth every 6 (six) hours as needed for pain. 06/08/18  Yes [provider]  fluconazole (DIFLUCAN) 150 MG tablet Take 1 tablet (150 mg total) by mouth once for 1 dose. 11/05/18 11/05/18  Cristina GongHammond, Lachina Salsberry W, PA-C  metroNIDAZOLE (FLAGYL) 500 MG tablet Take 1 tablet (500 mg total) by mouth 2 (two) times daily. 11/05/18   Cristina GongHammond, Devonta Blanford W, PA-C    Family History History reviewed. No pertinent family history.  Social History Social History   Tobacco Use   Smoking status: Current Every Day Smoker    Types: Cigars   Smokeless tobacco: Never Used  Substance Use Topics   Alcohol use: No   Drug use: Yes    Frequency: 1.0 times per week    Types: Marijuana     Allergies   Other, Banana, and Eggs or egg-derived products   Review of Systems Review of Systems  Constitutional: Negative for chills and fever.  Respiratory: Negative for cough, chest tightness and shortness of breath.   Cardiovascular: Negative for chest pain.  Gastrointestinal: Positive for abdominal pain. Negative for diarrhea, nausea and vomiting.  Genitourinary: Positive for pelvic pain, urgency and vaginal discharge. Negative for decreased urine volume, difficulty urinating, dysuria, frequency, vaginal bleeding and vaginal pain.  Neurological: Negative for headaches.  All other systems reviewed and are negative.  Physical Exam Updated Vital Signs BP 106/64    Pulse 76    Temp 98.2 F (36.8 C) (Oral)    Resp 16    Ht 5\' 2"  (1.575 m)    Wt 68 kg    LMP 10/18/2018    SpO2 99%    BMI 27.44 kg/m   Physical Exam Vitals signs and nursing note reviewed. Exam conducted with a chaperone present Nurse, mental health ).  Constitutional:      General: She is not in acute distress.    Appearance: She is well-developed. She is not diaphoretic.  HENT:     Head: Normocephalic and atraumatic.     Mouth/Throat:     Mouth:  Mucous membranes are moist.  Eyes:     General: No scleral icterus.       Right eye: No discharge.        Left eye: No discharge.     Conjunctiva/sclera: Conjunctivae normal.  Neck:     Musculoskeletal: Normal range of motion.  Cardiovascular:     Rate and Rhythm: Normal rate and regular rhythm.     Heart sounds: Normal heart sounds. No murmur.  Pulmonary:     Effort: Pulmonary effort is normal. No respiratory distress.     Breath sounds: No stridor.  Abdominal:     General: Abdomen is flat. Bowel sounds are normal. There is no distension.     Palpations: Abdomen is soft.     Tenderness: There is abdominal tenderness in the suprapubic area.     Hernia: No hernia is present.  Genitourinary:    Vagina: Vaginal discharge (thick, yellow) present. No tenderness or lesions.     Cervix: No cervical motion tenderness, friability, lesion, erythema or cervical bleeding.     Uterus: Normal.      Adnexa: Left adnexa normal.       Right: Mass present. No tenderness.         Left: No mass, tenderness or fullness.    Musculoskeletal:        General: No deformity.  Skin:    General: Skin is warm and dry.  Neurological:     General: No focal deficit present.     Mental Status: She is alert.     Cranial Nerves: No cranial nerve deficit.     Motor: No abnormal muscle tone.  Psychiatric:        Behavior: Behavior normal.      ED Treatments / Results  Labs (all labs ordered are listed, but only abnormal results are displayed) Labs Reviewed  WET PREP, GENITAL - Abnormal; Notable for the following components:      Result Value   Yeast Wet Prep HPF POC PRESENT (*)    Clue Cells Wet Prep HPF POC PRESENT (*)    WBC, Wet Prep HPF POC MANY (*)    All other components within normal limits  CBC WITH DIFFERENTIAL/PLATELET - Abnormal; Notable for the following components:   WBC 11.0 (*)    Neutro Abs 8.0 (*)    All other components within normal limits  URINALYSIS, ROUTINE W REFLEX MICROSCOPIC   PREGNANCY, URINE  COMPREHENSIVE METABOLIC PANEL  LIPASE, BLOOD  RPR  HIV ANTIBODY (ROUTINE TESTING W REFLEX)  GC/CHLAMYDIA PROBE AMP ([place]) NOT AT [facility]    EKG None  Radiology US Transvaginal Non-ob  Result Date: 11/05/2018 CLINICAL DATA:  Bilateral pelvic pain for 3 weeks. RIGHT-sided fullness on exam. LMP 10/18/2018. EXAM: TRANSABDOMINAL AND TRANSVAGINAL ULTRASOUND OF PELVIS DOPPLER  ULTRASOUND OF OVARIES TECHNIQUE: Both transabdominal and transvaginal ultrasound examinations of the pelvis were performed. Transabdominal technique was performed for global imaging of the pelvis including uterus, ovaries, adnexal regions, and pelvic cul-de-sac. It was necessary to proceed with endovaginal exam following the transabdominal exam to visualize the ovaries. Color and duplex Doppler ultrasound was utilized to evaluate blood flow to the ovaries. COMPARISON:  None. FINDINGS: Uterus Measurements: 8.1 x 4.2 x 4.9 centimeters = volume: 87.6 mL. No fibroids or other mass visualized. Endometrium Thickness: 2.3.  No focal abnormality visualized. Right ovary Measurements: 5.3 x 2.3 x 3.8 centimeters = volume: 24.0 mL. Dominant follicle is 2.0 centimeters. Numerous peripheral small follicles are present. Left ovary Measurements: 4.3 x 2.4 x 4.1 centimeters = volume: 22.4 mL. Numerous peripheral small follicles are present. Pulsed Doppler evaluation of both ovaries demonstrates normal low-resistance arterial and venous waveforms. Other findings Small amount of free pelvic fluid in the RIGHT adnexal region. IMPRESSION: 1. Normal appearance of the uterus/endometrium. 2. Prominent ovaries bilaterally with "string of pearl" sign, a finding which can be present in the setting of polycystic ovary syndrome. 3. No suspicious adnexal mass.  No torsion. Electronically Signed   By: Norva PavlovElizabeth  Brown M.D.   On: 11/05/2018 10:43   [place] Pelvis Complete  Result Date: 11/05/2018 CLINICAL DATA:  Bilateral pelvic pain for 3  weeks. RIGHT-sided fullness on exam. LMP 10/18/2018. EXAM: TRANSABDOMINAL AND TRANSVAGINAL ULTRASOUND OF PELVIS DOPPLER ULTRASOUND OF OVARIES TECHNIQUE: Both transabdominal and transvaginal ultrasound examinations of the pelvis were performed. Transabdominal technique was performed for global imaging of the pelvis including uterus, ovaries, adnexal regions, and pelvic cul-de-sac. It was necessary to proceed with endovaginal exam following the transabdominal exam to visualize the ovaries. Color and duplex Doppler ultrasound was utilized to evaluate blood flow to the ovaries. COMPARISON:  None. FINDINGS: Uterus Measurements: 8.1 x 4.2 x 4.9 centimeters = volume: 87.6 mL. No fibroids or other mass visualized. Endometrium Thickness: 2.3.  No focal abnormality visualized. Right ovary Measurements: 5.3 x 2.3 x 3.8 centimeters = volume: 24.0 mL. Dominant follicle is 2.0 centimeters. Numerous peripheral small follicles are present. Left ovary Measurements: 4.3 x 2.4 x 4.1 centimeters = volume: 22.4 mL. Numerous peripheral small follicles are present. Pulsed Doppler evaluation of both ovaries demonstrates normal low-resistance arterial and venous waveforms. Other findings Small amount of free pelvic fluid in the RIGHT adnexal region. IMPRESSION: 1. Normal appearance of the uterus/endometrium. 2. Prominent ovaries bilaterally with "string of pearl" sign, a finding which can be present in the setting of polycystic ovary syndrome. 3. No suspicious adnexal mass.  No torsion. Electronically Signed   By: Norva PavlovElizabeth  Brown M.D.   On: 11/05/2018 10:43   [place] Art/ven Flow Abd Pelv Doppler  Result Date: 11/05/2018 CLINICAL DATA:  Bilateral pelvic pain for 3 weeks. RIGHT-sided fullness on exam. LMP 10/18/2018. EXAM: TRANSABDOMINAL AND TRANSVAGINAL ULTRASOUND OF PELVIS DOPPLER ULTRASOUND OF OVARIES TECHNIQUE: Both transabdominal and transvaginal ultrasound examinations of the pelvis were performed. Transabdominal technique was  performed for global imaging of the pelvis including uterus, ovaries, adnexal regions, and pelvic cul-de-sac. It was necessary to proceed with endovaginal exam following the transabdominal exam to visualize the ovaries. Color and duplex Doppler ultrasound was utilized to evaluate blood flow to the ovaries. COMPARISON:  None. FINDINGS: Uterus Measurements: 8.1 x 4.2 x 4.9 centimeters = volume: 87.6 mL. No fibroids or other mass visualized. Endometrium Thickness: 2.3.  No focal abnormality visualized. Right ovary Measurements: 5.3 x 2.3 x 3.8 centimeters = volume: 24.0  mL. Dominant follicle is 2.0 centimeters. Numerous peripheral small follicles are present. Left ovary Measurements: 4.3 x 2.4 x 4.1 centimeters = volume: 22.4 mL. Numerous peripheral small follicles are present. Pulsed Doppler evaluation of both ovaries demonstrates normal low-resistance arterial and venous waveforms. Other findings Small amount of free pelvic fluid in the RIGHT adnexal region. IMPRESSION: 1. Normal appearance of the uterus/endometrium. 2. Prominent ovaries bilaterally with "string of pearl" sign, a finding which can be present in the setting of polycystic ovary syndrome. 3. No suspicious adnexal mass.  No torsion. Electronically Signed   By: Norva PavlovElizabeth  Brown M.D.   On: 11/05/2018 10:43    Procedures Procedures (including critical care time)  Medications Ordered in ED Medications - No data to display   Initial Impression / Assessment and Plan / ED Course  I have reviewed the triage vital signs and the nursing notes.  Pertinent labs & imaging results that were available during my care of the patient were reviewed by me and considered in my medical decision making (see chart for details).       Patient presents today for evaluation of lower abdominal/pelvic pain.  On exam she had palpable fullness in the right adnexa.  Her wet prep was positive for BV and yeast.  Her white count is very slightly elevated at 11.0.  She  does not have any significant electrolyte derangements.  She is not anemic and is hemodynamically stable and afebrile.  Lipase is not elevated.  Her pain is more midline than right-sided.  She does still have her appendix.  Given the palpable fullness in the right adnexa pelvic ultrasound was obtained showing right-sided ovary consistent with PCOS with a normal-appearing left ovary.  She does not have cervical motion tenderness on exam.  We will treat for yeast and BV.  We did discuss that given her pain it would be extremely unlikely for this to be her appendix given that her pain has been going on for over 2 weeks and her white count is only 11 without tachycardia, tachypnea, or localized right lower quadrant pain.    We discussed that if her symptoms fail to improve with these treatments, or she worsens, develop fevers, then she needs to be seen again.  She is given follow-up with OB/GYN for her possible PCOS.  She is advised to refrain from all sexual activities.  She was offered empiric treatment for gonorrhea chlamydia which she refused at this time.  Return precautions were discussed with patient who states their understanding.  At the time of discharge patient denied any unaddressed complaints or concerns.  Patient is agreeable for discharge home.   Final Clinical Impressions(s) / ED Diagnoses   Final diagnoses:  Pelvic pain in female  PCOS (polycystic ovarian syndrome)  BV (bacterial vaginosis)  Yeast infection    ED Discharge Orders         Ordered    metroNIDAZOLE (FLAGYL) 500 MG tablet  2 times daily     11/05/18 1158    fluconazole (DIFLUCAN) 150 MG tablet   Once     11/05/18 1158           Cristina GongHammond, Maryn Freelove W, [place] JerseyPA-C 11/05/18 1448    Linwood DibblesKnapp, Jon, MD 11/06/18 1245

## 2018-11-07 LAB — GC/CHLAMYDIA PROBE AMP (~~LOC~~) NOT AT ARMC
Chlamydia: POSITIVE — AB
Neisseria Gonorrhea: NEGATIVE

## 2018-11-08 ENCOUNTER — Telehealth: Payer: Self-pay | Admitting: Student

## 2018-11-08 DIAGNOSIS — A749 Chlamydial infection, unspecified: Secondary | ICD-10-CM

## 2018-11-08 MED ORDER — AZITHROMYCIN 500 MG PO TABS: 1000.0000 mg | ORAL_TABLET | Freq: Once | ORAL | 0 refills | Status: AC

## 2018-11-08 NOTE — Telephone Encounter (Addendum)
Geraldine Solar tested positive for  Chlamydia. Patient was called by RN and allergies and pharmacy confirmed. Rx sent to pharmacy of choice.   Jorje Guild, NP 11/08/2018 10:11 AM       ----- Message from Bjorn Loser, RN sent at 11/07/2018  4:12 PM EDT ----- This patient tested positive for :  chlamydia  She "has NKDA", I have informed the patient of her results and confirmed her pharmacy is correct in her chart. Please send Rx.   Thank you,   Bjorn Loser, RN   Results faxed to [facility].

## 2018-11-27 ENCOUNTER — Ambulatory Visit (INDEPENDENT_AMBULATORY_CARE_PROVIDER_SITE_OTHER): Payer: Medicaid Other | Admitting: Family Medicine

## 2018-11-27 ENCOUNTER — Encounter: Payer: Self-pay | Admitting: Family Medicine

## 2018-11-27 ENCOUNTER — Other Ambulatory Visit: Payer: Self-pay

## 2018-11-27 DIAGNOSIS — R1030 Lower abdominal pain, unspecified: Secondary | ICD-10-CM | POA: Diagnosis present

## 2018-11-27 DIAGNOSIS — R109 Unspecified abdominal pain: Secondary | ICD-10-CM | POA: Insufficient documentation

## 2018-11-27 DIAGNOSIS — E282 Polycystic ovarian syndrome: Secondary | ICD-10-CM | POA: Diagnosis not present

## 2018-11-27 NOTE — Progress Notes (Signed)
Was having abdominal pain that has since resolved, states right ovary is enlarged.  Has had 1 child 25 yrs old.  Did ultrasound at hospital not sure which one new to the area. In chart was seen at [facility] long ED.

## 2018-11-27 NOTE — Assessment & Plan Note (Signed)
Likely related to mild PID from Chlamydia following menses, now improved and treated. Safe sex practices reviewed.

## 2018-11-27 NOTE — Assessment & Plan Note (Signed)
Not clear, u/s findings, only, but regular cycles.

## 2018-11-27 NOTE — Progress Notes (Signed)
   Subjective:    Patient ID: Alexandra Hughes is a 23 y.o. female presenting with Abdominal Pain  on 11/27/2018  HPI: Seen in ED. Found to have chlamydia and this caused her pain. Pain is significantly improved since her antibiotics. She is only with females sexually. Mentions that cycles are regular.   Review of Systems  Constitutional: Negative for chills and fever.  Respiratory: Negative for shortness of breath.   Cardiovascular: Negative for chest pain.  Gastrointestinal: Negative for abdominal pain, nausea and vomiting.  Genitourinary: Negative for dysuria.  Skin: Negative for rash.      Objective:    BP 106/73   Pulse 73   Wt 160 lb (72.6 kg)   BMI 29.26 kg/m  Physical Exam Constitutional:      General: She is not in acute distress.    Appearance: She is well-developed.  HENT:     Head: Normocephalic and atraumatic.  Eyes:     General: No scleral icterus. Neck:     Musculoskeletal: Neck supple.  Cardiovascular:     Rate and Rhythm: Normal rate.  Pulmonary:     Effort: Pulmonary effort is normal.  Abdominal:     Palpations: Abdomen is soft.  Skin:    General: Skin is warm and dry.  Neurological:     Mental Status: She is alert and oriented to person, place, and time.         Assessment & Plan:   Problem List Items Addressed This Visit      Unprioritized   PCOS (polycystic ovarian syndrome)    Not clear, u/s findings, only, but regular cycles.      Abdominal pain    Likely related to mild PID from Chlamydia following menses, now improved and treated. Safe sex practices reviewed.         Total face-to-face time with patient: 20 minutes. Over 50% of encounter was spent on counseling and coordination of care. Return in about 4 weeks (around 12/25/2018) for a CPE.  Donnamae Jude 11/27/2018 1:16 PM

## 2018-11-30 ENCOUNTER — Encounter (HOSPITAL_COMMUNITY): Payer: Self-pay

## 2018-11-30 ENCOUNTER — Emergency Department (HOSPITAL_COMMUNITY)
Admission: EM | Admit: 2018-11-30 | Discharge: 2018-11-30 | Disposition: A | Discharge status: Home / Self Care | Payer: Medicaid Other | Attending: Emergency Medicine | Admitting: Emergency Medicine

## 2018-11-30 ENCOUNTER — Other Ambulatory Visit: Payer: Self-pay

## 2018-11-30 DIAGNOSIS — Y999 Unspecified external cause status: Secondary | ICD-10-CM | POA: Insufficient documentation

## 2018-11-30 DIAGNOSIS — Y929 Unspecified place or not applicable: Secondary | ICD-10-CM | POA: Diagnosis not present

## 2018-11-30 DIAGNOSIS — S0990XA Unspecified injury of head, initial encounter: Secondary | ICD-10-CM

## 2018-11-30 DIAGNOSIS — S0083XA Contusion of other part of head, initial encounter: Secondary | ICD-10-CM | POA: Insufficient documentation

## 2018-11-30 DIAGNOSIS — Y939 Activity, unspecified: Secondary | ICD-10-CM | POA: Diagnosis not present

## 2018-11-30 DIAGNOSIS — H9203 Otalgia, bilateral: Secondary | ICD-10-CM | POA: Diagnosis not present

## 2018-11-30 DIAGNOSIS — F17290 Nicotine dependence, other tobacco product, uncomplicated: Secondary | ICD-10-CM | POA: Insufficient documentation

## 2018-11-30 DIAGNOSIS — T07XXXA Unspecified multiple injuries, initial encounter: Secondary | ICD-10-CM
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
## 2019-01-03 ENCOUNTER — Ambulatory Visit: Payer: Medicaid Other | Admitting: Family Medicine

## 2019-02-14 ENCOUNTER — Other Ambulatory Visit: Payer: Self-pay

## 2019-02-14 ENCOUNTER — Other Ambulatory Visit (HOSPITAL_COMMUNITY)
Admission: RE | Admit: 2019-02-14 | Discharge: 2019-02-14 | Disposition: A | Discharge status: Home / Self Care | Payer: Medicaid Other | Source: Ambulatory Visit | Attending: Family Medicine | Admitting: Family Medicine

## 2019-02-14 ENCOUNTER — Encounter: Payer: Self-pay | Admitting: Family Medicine

## 2019-02-14 ENCOUNTER — Ambulatory Visit (INDEPENDENT_AMBULATORY_CARE_PROVIDER_SITE_OTHER): Payer: Medicaid Other | Admitting: Family Medicine

## 2019-02-14 VITALS — BP 113/73 | HR 98 | Wt 148.0 lb

## 2019-02-14 DIAGNOSIS — Z01419 Encounter for gynecological examination (general) (routine) without abnormal findings: Secondary | ICD-10-CM

## 2019-02-14 DIAGNOSIS — Z0000 Encounter for general adult medical examination without abnormal findings: Secondary | ICD-10-CM

## 2019-02-14 NOTE — Progress Notes (Signed)
  Subjective:     Alexandra Hughes is a 23 y.o. female and is here for a comprehensive physical exam. The patient reports no problems.  The following portions of the patient's history were reviewed and updated as appropriate: allergies, current medications, past family history, past medical history, past social history, past surgical history and problem list.  Review of Systems Pertinent items noted in HPI and remainder of comprehensive ROS otherwise negative.   Objective:    BP 113/73   Pulse 98   Wt 148 lb (67.1 kg)   BMI 23.18 kg/m  General appearance: alert, cooperative and appears stated age Head: Normocephalic, without obvious abnormality, atraumatic Neck: no adenopathy, supple, symmetrical, trachea midline and thyroid not enlarged, symmetric, no tenderness/mass/nodules Lungs: clear to auscultation bilaterally Breasts: normal appearance, no masses or tenderness Heart: regular rate and rhythm, S1, S2 normal, no murmur, click, rub or gallop Abdomen: soft, non-tender; bowel sounds normal; no masses,  no organomegaly Pelvic: cervix normal in appearance, external genitalia normal, no adnexal masses or tenderness, no cervical motion tenderness, uterus normal size, shape, and consistency and vagina normal without discharge Extremities: extremities normal, atraumatic, no cyanosis or edema Pulses: 2+ and symmetric Skin: Skin color, texture, turgor normal. No rashes or lesions Lymph nodes: Cervical, supraclavicular, and axillary nodes normal. Neurologic: Grossly normal    Assessment:    Healthy female exam.      Plan:      Problem List Items Addressed This Visit    None    Visit Diagnoses    Encounter for annual routine gynecological examination    -  Primary   Relevant Orders   Cytology - PAP( [place])     Return in 1 year (on 02/14/2020).  See After Visit Summary for Counseling Recommendations

## 2019-02-14 NOTE — Patient Instructions (Signed)
 Preventive Care 21-25 Years Old, Female Preventive care refers to visits with your health care provider and lifestyle choices that can promote health and wellness. This includes:  A yearly physical exam. This may also be called an annual well check.  Regular dental visits and eye exams.  Immunizations.  Screening for certain conditions.  Healthy lifestyle choices, such as eating a healthy diet, getting regular exercise, not using drugs or products that contain nicotine and tobacco, and limiting alcohol use. What can I expect for my preventive care visit? Physical exam Your health care provider will check your:  Height and weight. This may be used to calculate body mass index (BMI), which tells if you are at a healthy weight.  Heart rate and blood pressure.  Skin for abnormal spots. Counseling Your health care provider may ask you questions about your:  Alcohol, tobacco, and drug use.  Emotional well-being.  Home and relationship well-being.  Sexual activity.  Eating habits.  Work and work environment.  Method of birth control.  Menstrual cycle.  Pregnancy history. What immunizations do I need?  Influenza (flu) vaccine  This is recommended every year. Tetanus, diphtheria, and pertussis (Tdap) vaccine  You may need a Td booster every 10 years. Varicella (chickenpox) vaccine  You may need this if you have not been vaccinated. Human papillomavirus (HPV) vaccine  If recommended by your health care provider, you may need three doses over 6 months. Measles, mumps, and rubella (MMR) vaccine  You may need at least one dose of MMR. You may also need a second dose. Meningococcal conjugate (MenACWY) vaccine  One dose is recommended if you are age 19-21 years and a first-year college student living in a residence hall, or if you have one of several medical conditions. You may also need additional booster doses. Pneumococcal conjugate (PCV13) vaccine  You may need  this if you have certain conditions and were not previously vaccinated. Pneumococcal polysaccharide (PPSV23) vaccine  You may need one or two doses if you smoke cigarettes or if you have certain conditions. Hepatitis A vaccine  You may need this if you have certain conditions or if you travel or work in places where you may be exposed to hepatitis A. Hepatitis B vaccine  You may need this if you have certain conditions or if you travel or work in places where you may be exposed to hepatitis B. Haemophilus influenzae type b (Hib) vaccine  You may need this if you have certain conditions. You may receive vaccines as individual doses or as more than one vaccine together in one shot (combination vaccines). Talk with your health care provider about the risks and benefits of combination vaccines. What tests do I need?  Blood tests  Lipid and cholesterol levels. These may be checked every 5 years starting at age 20.  Hepatitis C test.  Hepatitis B test. Screening  Diabetes screening. This is done by checking your blood sugar (glucose) after you have not eaten for a while (fasting).  Sexually transmitted disease (STD) testing.  BRCA-related cancer screening. This may be done if you have a family history of breast, ovarian, tubal, or peritoneal cancers.  Pelvic exam and Pap test. This may be done every 3 years starting at age 21. Starting at age 30, this may be done every 5 years if you have a Pap test in combination with an HPV test. Talk with your health care provider about your test results, treatment options, and if necessary, the need for more   tests. Follow these instructions at home: Eating and drinking   Eat a diet that includes fresh fruits and vegetables, whole grains, lean protein, and low-fat dairy.  Take vitamin and mineral supplements as recommended by your health care provider.  Do not drink alcohol if: ? Your health care provider tells you not to drink. ? You are  pregnant, may be pregnant, or are planning to become pregnant.  If you drink alcohol: ? Limit how much you have to 0-1 drink a day. ? Be aware of how much alcohol is in your drink. In the [place] one drink equals one 12 oz bottle of beer (355 mL), one 5 oz glass of wine (148 mL), or one 1 oz glass of hard liquor (44 mL). Lifestyle  Take daily care of your teeth and gums.  Stay active. Exercise for at least 30 minutes on 5 or more days each week.  Do not use any products that contain nicotine or tobacco, such as cigarettes, e-cigarettes, and chewing tobacco. If you need help quitting, ask your health care provider.  If you are sexually active, practice safe sex. Use a condom or other form of birth control (contraception) in order to prevent pregnancy and STIs (sexually transmitted infections). If you plan to become pregnant, see your health care provider for a preconception visit. What's next?  Visit your health care provider once a year for a well check visit.  Ask your health care provider how often you should have your eyes and teeth checked.  Stay up to date on all vaccines. This information is not intended to replace advice given to you by your health care provider. Make sure you discuss any questions you have with your health care provider. Document Released: 05/25/2001 Document Revised: 12/08/2017 Document Reviewed: 12/08/2017 Elsevier Patient Education  2020 [employer].

## 2019-02-16 LAB — CYTOLOGY - PAP
Chlamydia: NEGATIVE
Comment: NEGATIVE
Comment: NEGATIVE
Diagnosis: NEGATIVE
High risk HPV: POSITIVE — AB

## 2019-02-19 ENCOUNTER — Telehealth: Payer: Self-pay | Admitting: Family Medicine

## 2019-02-19 NOTE — Telephone Encounter (Signed)
I called Alexandra Hughes back and she wanted to discuss pap results . We discussed Dr. Kennon Hughes had sent her a message indicating her pap was normal. 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 We discussed some hrhpv can eventually lead to cervical cancer; but she does not have cervical cancer right now. We discussed she does not need any treatment now ;but should have another pap in one year with hpv testing. She voices understanding Alexandra Heyne,RN

## 2019-02-19 NOTE — Telephone Encounter (Signed)
[place] also left a voicemail about wanting to speak to Dr. Kennon Rounds about results at 9:18 am Jakevious Hollister,RN

## 2019-02-19 NOTE — Telephone Encounter (Signed)
Patient has questions about her last visit.  °

## 2019-04-12 ENCOUNTER — Other Ambulatory Visit: Payer: Self-pay

## 2019-04-12 ENCOUNTER — Encounter (HOSPITAL_COMMUNITY): Payer: Self-pay | Admitting: Emergency Medicine

## 2019-04-12 ENCOUNTER — Emergency Department (HOSPITAL_COMMUNITY)
Admission: EM | Admit: 2019-04-12 | Discharge: 2019-04-13 | Disposition: A | Discharge status: Home / Self Care | Payer: Medicaid Other | Attending: Emergency Medicine | Admitting: Emergency Medicine

## 2019-04-12 DIAGNOSIS — F1210 Cannabis abuse, uncomplicated: Secondary | ICD-10-CM | POA: Insufficient documentation

## 2019-04-12 DIAGNOSIS — F17210 Nicotine dependence, cigarettes, uncomplicated: Secondary | ICD-10-CM | POA: Diagnosis not present

## 2019-04-12 DIAGNOSIS — R197 Diarrhea, unspecified: Secondary | ICD-10-CM | POA: Diagnosis not present

## 2019-04-12 DIAGNOSIS — R109 Unspecified abdominal pain: Secondary | ICD-10-CM | POA: Diagnosis present

## 2019-04-12 MED ORDER — SODIUM CHLORIDE 0.9% FLUSH: 3.0000 mL | Freq: Once | INTRAVENOUS | Status: DC

## 2019-04-12 NOTE — ED Triage Notes (Signed)
Patient here from home reporting abd pain "all over" that started at 8pm. Also reports diarrhea.

## 2019-04-13 LAB — COMPREHENSIVE METABOLIC PANEL
ALT: 16 U/L (ref 0–44)
AST: 20 U/L (ref 15–41)
Albumin: 4.3 g/dL (ref 3.5–5.0)
Alkaline Phosphatase: 61 U/L (ref 38–126)
Anion gap: 6 (ref 5–15)
BUN: 8 mg/dL (ref 6–20)
CO2: 24 mmol/L (ref 22–32)
Calcium: 9.3 mg/dL (ref 8.9–10.3)
Chloride: 108 mmol/L (ref 98–111)
Creatinine, Ser: 0.64 mg/dL (ref 0.44–1.00)
GFR calc Af Amer: >60 mL/min (ref >60)
GFR calc non Af Amer: >60 mL/min (ref >60)
Glucose, Bld: 93 mg/dL (ref 70–99)
Potassium: 4.2 mmol/L (ref 3.5–5.1)
Sodium: 138 mmol/L (ref 135–145)
Total Bilirubin: 0.4 mg/dL (ref 0.3–1.2)
Total Protein: 7.6 g/dL (ref 6.5–8.1)

## 2019-04-13 LAB — URINALYSIS, ROUTINE W REFLEX MICROSCOPIC
Bacteria, UA: NONE SEEN
Bilirubin Urine: NEGATIVE
Glucose, UA: NEGATIVE mg/dL
Ketones, ur: NEGATIVE mg/dL
Leukocytes,Ua: NEGATIVE
Nitrite: NEGATIVE
Protein, ur: NEGATIVE mg/dL
Specific Gravity, Urine: 1.017 (ref 1.005–1.030)
pH: 7 (ref 5.0–8.0)

## 2019-04-13 LAB — CBC
HCT: 40.5 % (ref 36.0–46.0)
Hemoglobin: 12.8 g/dL (ref 12.0–15.0)
MCH: 29.2 pg (ref 26.0–34.0)
MCHC: 31.6 g/dL (ref 30.0–36.0)
MCV: 92.3 fL (ref 80.0–100.0)
Platelets: 232 10*3/uL (ref 150–400)
RBC: 4.39 MIL/uL (ref 3.87–5.11)
RDW: 13.3 % (ref 11.5–15.5)
WBC: 7.7 10*3/uL (ref 4.0–10.5)
nRBC: 0 % (ref 0.0–0.2)

## 2019-04-13 LAB — LIPASE, BLOOD: Lipase: 33 U/L (ref 11–51)

## 2019-04-13 LAB — I-STAT BETA HCG BLOOD, ED (MC, WL, AP ONLY): I-stat hCG, quantitative: <5 m[IU]/mL (ref <5)

## 2019-04-13 MED ORDER — ONDANSETRON 4 MG PO TBDP
4.0000 mg | ORAL_TABLET | Freq: Once | ORAL | Status: AC
  Administered 2019-04-13: 4 mg via ORAL
  Filled 2019-04-13: qty 1

## 2019-04-13 MED ORDER — LOPERAMIDE HCL 2 MG PO CAPS: 2.0000 mg | ORAL_CAPSULE | Freq: Four times a day (QID) | ORAL | 0 refills | Status: AC | PRN

## 2019-04-13 MED ORDER — DICYCLOMINE HCL 10 MG PO CAPS
20.0000 mg | ORAL_CAPSULE | Freq: Once | ORAL | Status: AC
  Administered 2019-04-13: 20 mg via ORAL
  Filled 2019-04-13: qty 2

## 2019-04-13 MED ORDER — DICYCLOMINE HCL 20 MG PO TABS: 20.0000 mg | ORAL_TABLET | Freq: Two times a day (BID) | ORAL | 0 refills | Status: AC

## 2019-04-13 NOTE — ED Notes (Signed)
Pt ambulatory to the BR. Attempting to give sample now.

## 2019-04-13 NOTE — Discharge Instructions (Signed)
Drinks lots of fluids.  Bland diet for now and progress back to normal as tolerated. Follow-up with your primary care doctor. Return here for any new/acute changes.

## 2019-04-13 NOTE — ED Provider Notes (Signed)
[facility]-EMERGENCY DEPT Provider Note   CSN: [id number] Arrival date & time: 04/12/19  2335     History Chief Complaint  Patient presents with  . Abdominal Pain  . Diarrhea    Alexandra Hughes is a 24 y.o. female.  The history is provided by the patient and medical records.  Abdominal Pain Associated symptoms: diarrhea   Diarrhea Associated symptoms: abdominal pain     24 y.o. F with hx of PCOS, presenting to the ED for abdominal pain.  States she ate a [employer] today for lunch around 1PM and then went home to sleep as she works third shift.  States she woke up around 8pm with intermittent pains throughout her abdomen, worse around the navel.  States pain has continued coming and going, associated with 2 bouts of watery diarrhea.  No vomiting but has had some mild nausea.  Has been able to eat/drink since then.  Denies fevers, chills, sweats.  No meds PTA.  No prior surgical history.  History reviewed. No pertinent past medical history.  Patient Active Problem List   Diagnosis Date Noted  . Abdominal pain 11/27/2018  . PCOS (polycystic ovarian syndrome) 11/05/2018    Past Surgical History:  Procedure Laterality Date  . NECK SURGERY  2013, 2016     OB History    Gravida  1   Para  1   Term  1   Preterm      AB      Living  1     SAB      TAB      Ectopic      Multiple      Live Births              Family History  Problem Relation Age of Onset  . Asthma Maternal Grandmother   . Aneurysm Father   . Arthritis Mother   . Deep vein thrombosis Mother   . Hypertension Mother   . Asthma Mother   . Asthma Brother   . Hypertension Sister   . Asthma Son   . Asthma Nephew     Social History   Tobacco Use  . Smoking status: Current Every Day Smoker    Types: Cigars  . Smokeless tobacco: Never Used  Substance Use Topics  . Alcohol use: No  . Drug use: Yes    Frequency: 1.0 times per week    Types: Marijuana      Home Medications Prior to Admission medications   Medication Sig Start Date End Date Taking? Authorizing Provider  aspirin-acetaminophen-caffeine (EXCEDRIN MIGRAINE) [phone] MG tablet Take 2 tablets by mouth every 6 (six) hours as needed for headache.    [provider]  traMADol (ULTRAM) 50 MG tablet Take 50 mg by mouth every 6 (six) hours as needed for pain. 06/08/18   [provider]    Allergies    Other, Banana, Eggs or egg-derived products, and Penicillins  Review of Systems   Review of Systems  Gastrointestinal: Positive for abdominal pain and diarrhea.  All other systems reviewed and are negative.   Physical Exam Updated Vital Signs BP 130/76 (BP Location: Right Arm)   Pulse 78   Temp 98.3 F (36.8 C) (Oral)   Resp 16   SpO2 100%   Physical Exam Vitals and nursing note reviewed.  Constitutional:      Appearance: She is well-developed.     Comments: NAD, texting on cell phone  HENT:  Head: Normocephalic and atraumatic.  Eyes:     Conjunctiva/sclera: Conjunctivae normal.     Pupils: Pupils are equal, round, and reactive to light.  Cardiovascular:     Rate and Rhythm: Normal rate and regular rhythm.     Heart sounds: Normal heart sounds.  Pulmonary:     Effort: Pulmonary effort is normal.     Breath sounds: Normal breath sounds.  Abdominal:     General: Bowel sounds are normal.     Palpations: Abdomen is soft.     Tenderness: There is no abdominal tenderness. There is no rebound.     Comments: Soft, non-tender  Musculoskeletal:        General: Normal range of motion.     Cervical back: Normal range of motion.  Skin:    General: Skin is warm and dry.  Neurological:     Mental Status: She is alert and oriented to person, place, and time.     ED Results / Procedures / Treatments   Labs (all labs ordered are listed, but only abnormal results are displayed) Labs Reviewed  URINALYSIS, ROUTINE W REFLEX MICROSCOPIC - Abnormal;  Notable for the following components:      Result Value   Hgb urine dipstick SMALL (*)    All other components within normal limits  LIPASE, BLOOD  COMPREHENSIVE METABOLIC PANEL  CBC  I-STAT BETA HCG BLOOD, ED ([facility], [facility], [facility] ONLY)    EKG None  Radiology No results found.  Procedures Procedures (including critical care time)  Medications Ordered in ED Medications  sodium chloride flush (NS) 0.9 % injection 3 mL (0 mLs Intravenous Hold 04/12/19 2359)  dicyclomine (BENTYL) capsule 20 mg (20 mg Oral Given 04/13/19 0042)  ondansetron (ZOFRAN-ODT) disintegrating tablet 4 mg (4 mg Oral Given 04/13/19 0043)    ED Course  I have reviewed the triage vital signs and the nursing notes.  Pertinent labs & imaging results that were available during my care of the patient were reviewed by me and considered in my medical decision making (see chart for details).    MDM Rules/Calculators/A&P  24 year old female presenting to the ED with abdominal pain and diarrhea that began today around 6 PM.  Does admit to eating a fast food burger earlier today and unsure if this upset her stomach.  She is afebrile and nontoxic.  Her abdomen is soft and completely benign.  Labs are reassuring.  She is not had any further diarrhea here.  No further cramping after Bentyl.  Possibly food related versus viral etiology.  Remains afebrile nontoxic.  Stable for discharge with symptomatic care.  Recommended good oral hydration and brat diet.  Can follow-up with PCP.  Return here for any new or acute changes.  Final Clinical Impression(s) / ED Diagnoses Final diagnoses:  Diarrhea, unspecified type    Rx / DC Orders ED Discharge Orders         Ordered    dicyclomine (BENTYL) 20 MG tablet  2 times daily     04/13/19 0209    loperamide (IMODIUM) 2 MG capsule  4 times daily PRN     04/13/19 0209           Larene Pickett, PA-C 04/13/19 0214    Fatima Blank, MD 04/13/19 [phone]

## 2019-04-19 ENCOUNTER — Ambulatory Visit: Payer: Medicaid Other | Attending: Internal Medicine

## 2019-04-19 DIAGNOSIS — Z20822 Contact with and (suspected) exposure to covid-19: Secondary | ICD-10-CM

## 2019-04-21 LAB — NOVEL CORONAVIRUS, NAA: SARS-CoV-2, NAA: NOT DETECTED

## 2019-05-03 ENCOUNTER — Other Ambulatory Visit: Payer: Self-pay

## 2019-05-03 ENCOUNTER — Emergency Department (HOSPITAL_COMMUNITY)
Admission: EM | Admit: 2019-05-03 | Discharge: 2019-05-03 | Discharge status: Against Medical Advice | Payer: Medicaid Other | Attending: Emergency Medicine | Admitting: Emergency Medicine

## 2019-05-03 DIAGNOSIS — N3001 Acute cystitis with hematuria: Secondary | ICD-10-CM | POA: Insufficient documentation

## 2019-05-03 DIAGNOSIS — R300 Dysuria: Secondary | ICD-10-CM | POA: Diagnosis present

## 2019-05-03 DIAGNOSIS — F17290 Nicotine dependence, other tobacco product, uncomplicated: Secondary | ICD-10-CM | POA: Insufficient documentation

## 2019-05-03 DIAGNOSIS — E282 Polycystic ovarian syndrome: Secondary | ICD-10-CM | POA: Insufficient documentation

## 2019-05-03 LAB — URINALYSIS, ROUTINE W REFLEX MICROSCOPIC
Bilirubin Urine: NEGATIVE
Glucose, UA: NEGATIVE mg/dL
Ketones, ur: NEGATIVE mg/dL
Nitrite: NEGATIVE
Protein, ur: 100 mg/dL — AB
RBC / HPF: >50 RBC/hpf — ABNORMAL HIGH (ref 0–5)
Specific Gravity, Urine: 1.026 (ref 1.005–1.030)
WBC, UA: >50 WBC/hpf — ABNORMAL HIGH (ref 0–5)
pH: 6 (ref 5.0–8.0)

## 2019-05-03 LAB — PREGNANCY, URINE: Preg Test, Ur: NEGATIVE

## 2019-05-03 MED ORDER — CEPHALEXIN 500 MG PO CAPS: 500.0000 mg | ORAL_CAPSULE | Freq: Two times a day (BID) | ORAL | 0 refills | Status: AC

## 2019-05-03 MED ORDER — PHENAZOPYRIDINE HCL 200 MG PO TABS: 200.0000 mg | ORAL_TABLET | Freq: Three times a day (TID) | ORAL | 0 refills | Status: AC

## 2019-05-03 NOTE — ED Triage Notes (Signed)
Pt from home.   Pt reports dysuria, hematuria, polyuria, strong smelling urine starting Tuesday.  Pt reports LMP 04/27/2019, pain started right after ending period.

## 2019-05-03 NOTE — ED Notes (Signed)
Pt not visualized in room.  Reported by NT that pt was visualized walking out of unit towards exit, steady gait, NAD.

## 2019-05-03 NOTE — ED Provider Notes (Signed)
[place] [facility] DEPT Provider Note   CSN: [id number] Arrival date & time: 05/03/19  8546     History Chief Complaint  Patient presents with  . Dysuria    Alexandra Hughes is a 24 y.o. female presents to the ER for evaluation of pain with urination that began 2 days ago.  Reports pain at the end of the urine stream.  Has also noticed strong urine odor, darker urine, increased urination.  Reports some blood in her urine at the end of the urine stream and with wiping.  Her LMP was 1/15 to 1/19, her symptoms began the day after the last day of her menses.  Has been wearing a pad and states she has not noticed any vaginal bleeding.  Denies any fever, nausea or vomiting.  No abdominal or flank pain.  Other than vaginal bleeding during her menses she denies any abnormal vaginal discharge.  No pelvic or vaginal pain other than when urinating. No genital lesions. She is not concerned about STD.  She has a Nexplanon that expired in 2016 and PCP has sent an order for OB/GYN to remove this.  No interventions.  No modifying factors.  No history of kidney stones.   HPI     No past medical history on file.  Patient Active Problem List   Diagnosis Date Noted  . Abdominal pain 11/27/2018  . PCOS (polycystic ovarian syndrome) 11/05/2018    Past Surgical History:  Procedure Laterality Date  . NECK SURGERY  2013, 2016     OB History    Gravida  1   Para  1   Term  1   Preterm      AB      Living  1     SAB      TAB      Ectopic      Multiple      Live Births              Family History  Problem Relation Age of Onset  . Asthma Maternal Grandmother   . Aneurysm Father   . Arthritis Mother   . Deep vein thrombosis Mother   . Hypertension Mother   . Asthma Mother   . Asthma Brother   . Hypertension Sister   . Asthma Son   . Asthma Nephew     Social History   Tobacco Use  . Smoking status: Current Every Day Smoker    Types: Cigars  .  Smokeless tobacco: Never Used  Substance Use Topics  . Alcohol use: No  . Drug use: Yes    Frequency: 1.0 times per week    Types: Marijuana    Home Medications Prior to Admission medications   Medication Sig Start Date End Date Taking? Authorizing Provider  aspirin-acetaminophen-caffeine (EXCEDRIN MIGRAINE) [phone] MG tablet Take 2 tablets by mouth every 6 (six) hours as needed for headache.    [provider]  cephALEXin (KEFLEX) 500 MG capsule Take 1 capsule (500 mg total) by mouth 2 (two) times daily for 7 days. 05/03/19 05/10/19  Kinnie Feil, PA-C  dicyclomine (BENTYL) 20 MG tablet Take 1 tablet (20 mg total) by mouth 2 (two) times daily. 04/13/19   Larene Pickett, PA-C  loperamide (IMODIUM) 2 MG capsule Take 1 capsule (2 mg total) by mouth 4 (four) times daily as needed for diarrhea or loose stools. 04/13/19   Larene Pickett, PA-C  phenazopyridine (PYRIDIUM) 200 MG tablet Take 1 tablet (200  mg total) by mouth 3 (three) times daily. 05/03/19   Liberty Handy, PA-C  traMADol (ULTRAM) 50 MG tablet Take 50 mg by mouth every 6 (six) hours as needed for pain. 06/08/18   [provider]    Allergies    Other, Banana, Eggs or egg-derived products, and Penicillins  Review of Systems   Review of Systems  Genitourinary: Positive for dysuria, frequency and hematuria.  All other systems reviewed and are negative.   Physical Exam Updated Vital Signs BP 129/76   Pulse (!) 103   Temp 98.2 F (36.8 C) (Oral)   Resp 16   Ht 5\' 2"  (1.575 m)   Wt 68 kg   LMP 04/27/2019 (Exact Date)   SpO2 100%   BMI 27.44 kg/m   Physical Exam Vitals and nursing note reviewed.  Constitutional:      Appearance: She is well-developed.     Comments: Non toxic in NAD  HENT:     Head: Normocephalic and atraumatic.     Nose: Nose normal.  Eyes:     Conjunctiva/sclera: Conjunctivae normal.  Cardiovascular:     Rate and Rhythm: Normal rate and regular rhythm.  Pulmonary:      Effort: Pulmonary effort is normal.     Breath sounds: Normal breath sounds.  Abdominal:     General: Bowel sounds are normal.     Palpations: Abdomen is soft.     Tenderness: There is no abdominal tenderness.     Comments: No G/R/R. No suprapubic or CVA tenderness. Negative Murphy's and McBurney's. Active BS to lower quadrants.   Musculoskeletal:        General: Normal range of motion.     Cervical back: Normal range of motion.  Skin:    General: Skin is warm and dry.     Capillary Refill: Capillary refill takes less than 2 seconds.  Neurological:     Mental Status: She is alert.  Psychiatric:        Behavior: Behavior normal.     ED Results / Procedures / Treatments   Labs (all labs ordered are listed, but only abnormal results are displayed) Labs Reviewed  URINALYSIS, ROUTINE W REFLEX MICROSCOPIC - Abnormal; Notable for the following components:      Result Value   APPearance CLOUDY (*)    Hgb urine dipstick LARGE (*)    Protein, ur 100 (*)    Leukocytes,Ua MODERATE (*)    RBC / HPF >50 (*)    WBC, UA >50 (*)    Bacteria, UA RARE (*)    All other components within normal limits  URINE CULTURE  PREGNANCY, URINE    EKG None  Radiology No results found.  Procedures Procedures (including critical care time)  Medications Ordered in ED Medications - No data to display  ED Course  I have reviewed the triage vital signs and the nursing notes.  Pertinent labs & imaging results that were available during my care of the patient were reviewed by me and considered in my medical decision making (see chart for details).  Clinical Course as of May 02 1036  Thu May 03, 2019  May 05, 2019 Squamous Epithelial / LPF: 0-5 [CG]  0851 1638): MODERATE [CG]  0851 WBC, UA(!): >50 [CG]  0851 Bacteria, UA(!): RARE [CG]    Clinical Course User Index [CG] Glori Luis, PA-C   MDM Rules/Calculators/A&P  UA suggestive of UTI.  Pt has no fever,  vomiting, suprapubic or CVA tenderness. No symptoms to suggest pyelonephritis, renal stone.  No STD symptoms and pt not concerned about this. GU exam not indicated.    1035: RN notified me pt walked out from ER.  Contacted patient and notified her of UTI and antibiotics sent to pharmacy. She will go pick medicines up in pharmacy.  Return precautions discussed over the phone. She is agreeable with POC.  Final Clinical Impression(s) / ED Diagnoses Final diagnoses:  Acute cystitis with hematuria    Rx / DC Orders ED Discharge Orders         Ordered    cephALEXin (KEFLEX) 500 MG capsule  2 times daily     05/03/19 0928    phenazopyridine (PYRIDIUM) 200 MG tablet  3 times daily     05/03/19 9833           Liberty Handy, PA-C 05/03/19 1038    Terrilee Files, MD 05/03/19 [phone]

## 2019-05-05 LAB — URINE CULTURE: Culture: >=100000 — AB

## 2019-05-06 ENCOUNTER — Telehealth: Payer: Self-pay | Admitting: Emergency Medicine

## 2019-05-06 NOTE — Telephone Encounter (Signed)
Post ED Visit - Positive Culture Follow-up  Culture report reviewed by antimicrobial stewardship pharmacist: [facility] [x]  , Pharm.D. []  Enzo Bi, Pharm.D., BCPS AQ-ID []  , Pharm.D., BCPS []  Celedonio Miyamoto, .D., BCPS []  [place], .D., BCPS, AAHIVP []  Georgina Pillion, Pharm.D., BCPS, AAHIVP []  [address], PharmD, BCPS []  , PharmD, BCPS []  [place], PharmD, BCPS []  [address], PharmD []  , PharmD, BCPS []  Estella Husk, PharmD  Pharmacy Team []  Lysle Pearl, PharmD []  , PharmD []  Phillips Climes, PharmD []  , Rph []  Agapito Games) , PharmD []  Verlan Friends, PharmD []  , PharmD []  Mervyn Gay, PharmD []  , PharmD []  Vinnie Level, PharmD []  [facility], PharmD []  , PharmD []  Len Childs, PharmD   Positive urine culture Treated with Cephalexin, organism sensitive to the same and no further patient follow-up is required at this time.  05/06/2019, 4:36 PM

## 2019-07-19 ENCOUNTER — Emergency Department (HOSPITAL_COMMUNITY): Payer: Medicaid Other

## 2019-07-19 ENCOUNTER — Other Ambulatory Visit: Payer: Self-pay

## 2019-07-19 ENCOUNTER — Encounter (HOSPITAL_COMMUNITY): Payer: Self-pay | Admitting: Emergency Medicine

## 2019-07-19 ENCOUNTER — Emergency Department (HOSPITAL_COMMUNITY)
Admission: EM | Admit: 2019-07-19 | Discharge: 2019-07-19 | Disposition: A | Discharge status: Home / Self Care | Payer: Medicaid Other | Attending: Emergency Medicine | Admitting: Emergency Medicine

## 2019-07-19 DIAGNOSIS — Y9389 Activity, other specified: Secondary | ICD-10-CM | POA: Diagnosis not present

## 2019-07-19 DIAGNOSIS — S12121A Other nondisplaced dens fracture, initial encounter for closed fracture: Secondary | ICD-10-CM | POA: Diagnosis not present

## 2019-07-19 DIAGNOSIS — F17290 Nicotine dependence, other tobacco product, uncomplicated: Secondary | ICD-10-CM | POA: Diagnosis not present

## 2019-07-19 DIAGNOSIS — R0602 Shortness of breath: Secondary | ICD-10-CM | POA: Diagnosis not present

## 2019-07-19 DIAGNOSIS — R200 Anesthesia of skin: Secondary | ICD-10-CM | POA: Insufficient documentation

## 2019-07-19 DIAGNOSIS — S12100A Unspecified displaced fracture of second cervical vertebra, initial encounter for closed fracture: Secondary | ICD-10-CM

## 2019-07-19 DIAGNOSIS — Y929 Unspecified place or not applicable: Secondary | ICD-10-CM | POA: Insufficient documentation

## 2019-07-19 DIAGNOSIS — R519 Headache, unspecified: Secondary | ICD-10-CM | POA: Insufficient documentation

## 2019-07-19 DIAGNOSIS — Z981 Arthrodesis status: Secondary | ICD-10-CM | POA: Diagnosis not present

## 2019-07-19 DIAGNOSIS — Y999 Unspecified external cause status: Secondary | ICD-10-CM | POA: Diagnosis not present

## 2019-07-19 DIAGNOSIS — R531 Weakness: Secondary | ICD-10-CM | POA: Diagnosis not present

## 2019-07-19 DIAGNOSIS — M542 Cervicalgia: Secondary | ICD-10-CM | POA: Diagnosis not present

## 2019-07-19 DIAGNOSIS — M25512 Pain in left shoulder: Secondary | ICD-10-CM | POA: Diagnosis present

## 2019-07-19 DIAGNOSIS — S4992XA Unspecified injury of left shoulder and upper arm, initial encounter: Secondary | ICD-10-CM

## 2019-07-19 LAB — COMPREHENSIVE METABOLIC PANEL
ALT: 12 U/L (ref 0–44)
AST: 17 U/L (ref 15–41)
Albumin: 4.4 g/dL (ref 3.5–5.0)
Alkaline Phosphatase: 70 U/L (ref 38–126)
Anion gap: 7 (ref 5–15)
BUN: 6 mg/dL (ref 6–20)
CO2: 28 mmol/L (ref 22–32)
Calcium: 9.8 mg/dL (ref 8.9–10.3)
Chloride: 105 mmol/L (ref 98–111)
Creatinine, Ser: 0.6 mg/dL (ref 0.44–1.00)
GFR calc Af Amer: >60 mL/min (ref >60)
GFR calc non Af Amer: >60 mL/min (ref >60)
Glucose, Bld: 97 mg/dL (ref 70–99)
Potassium: 4.1 mmol/L (ref 3.5–5.1)
Sodium: 140 mmol/L (ref 135–145)
Total Bilirubin: 0.5 mg/dL (ref 0.3–1.2)
Total Protein: 8.4 g/dL — ABNORMAL HIGH (ref 6.5–8.1)

## 2019-07-19 LAB — I-STAT BETA HCG BLOOD, ED (MC, WL, AP ONLY): I-stat hCG, quantitative: <5 m[IU]/mL (ref <5)

## 2019-07-19 LAB — CBC WITH DIFFERENTIAL/PLATELET
Abs Immature Granulocytes: 0.02 10*3/uL (ref 0.00–0.07)
Basophils Absolute: 0.1 10*3/uL (ref 0.0–0.1)
Basophils Relative: 1 %
Eosinophils Absolute: 0 10*3/uL (ref 0.0–0.5)
Eosinophils Relative: 0 %
HCT: 40.2 % (ref 36.0–46.0)
Hemoglobin: 12.9 g/dL (ref 12.0–15.0)
Immature Granulocytes: 0 %
Lymphocytes Relative: 24 %
Lymphs Abs: 1.8 10*3/uL (ref 0.7–4.0)
MCH: 29 pg (ref 26.0–34.0)
MCHC: 32.1 g/dL (ref 30.0–36.0)
MCV: 90.3 fL (ref 80.0–100.0)
Monocytes Absolute: 0.5 10*3/uL (ref 0.1–1.0)
Monocytes Relative: 6 %
Neutro Abs: 5.3 10*3/uL (ref 1.7–7.7)
Neutrophils Relative %: 69 %
Platelets: 222 10*3/uL (ref 150–400)
RBC: 4.45 MIL/uL (ref 3.87–5.11)
RDW: 12.9 % (ref 11.5–15.5)
WBC: 7.6 10*3/uL (ref 4.0–10.5)
nRBC: 0 % (ref 0.0–0.2)

## 2019-07-19 MED ORDER — PROCHLORPERAZINE EDISYLATE 10 MG/2ML IJ SOLN
10.0000 mg | Freq: Once | INTRAMUSCULAR | Status: AC
  Administered 2019-07-19: 10:00:00 10 mg via INTRAVENOUS
  Filled 2019-07-19: qty 2

## 2019-07-19 MED ORDER — SODIUM CHLORIDE 0.9 % IV BOLUS
1000.0000 mL | Freq: Once | INTRAVENOUS | Status: AC
  Administered 2019-07-19: 10:00:00 1000 mL via INTRAVENOUS

## 2019-07-19 MED ORDER — DIPHENHYDRAMINE HCL 50 MG/ML IJ SOLN
25.0000 mg | Freq: Once | INTRAMUSCULAR | Status: AC
  Administered 2019-07-19: 10:00:00 25 mg via INTRAVENOUS
  Filled 2019-07-19: qty 1

## 2019-07-19 MED ORDER — SODIUM CHLORIDE (PF) 0.9 % IJ SOLN
INTRAMUSCULAR | Status: AC
  Filled 2019-07-19: qty 50

## 2019-07-19 MED ORDER — IOHEXOL 350 MG/ML SOLN
100.0000 mL | Freq: Once | INTRAVENOUS | Status: AC | PRN
  Administered 2019-07-19: 100 mL via INTRAVENOUS

## 2019-07-19 MED ORDER — TRAMADOL HCL 50 MG PO TABS: 50.0000 mg | ORAL_TABLET | Freq: Four times a day (QID) | ORAL | 0 refills | Status: AC | PRN
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
## 2019-07-19 NOTE — SANE Note (Signed)
On 07/19/2019, at approximately 1315 hours, the SANE/FNE Teacher, music) consult was completed. The physician was notified. Please contact the SANE/FNE nurse on call (listed in Amion) with any further concerns.

## 2019-07-19 NOTE — Discharge Instructions (Signed)
Your work-up today was concerning for several things.  We found a possible injury to your left acromioclavicular joint in your shoulder which could be contributing to the pain in the shoulder.  Please use a sling and follow-up with orthopedics for this.  For your neck, please follow-up with neurosurgery for further evaluation management of the cord changes likely from your prior injuries.  Please try and avoid further neck injuries.  For the headaches, please help with neurology and they may want to do further MRI which we offered today for that abnormality seen on the CT scan.  There was no evidence of aneurysm or dissection seen.  Please follow-up with your PCP for further management of the anxiety and overall health management.  Please rest and stay hydrated.  If any symptoms change or worsen, please return to the nearest emergency department immediately.

## 2019-07-19 NOTE — SANE Note (Signed)
Follow-up Phone Call  Patient gives verbal consent for a FNE/SANE follow-up phone call in 48-72 hours: DID NOT ASK THE PT. Patient's telephone number: [phone] (PT'S CELL W/ VM & TEXTING). Patient gives verbal consent to leave voicemail at the phone number listed above: DID NOT ASK THE PT. DO NOT CALL between the hours of: N/A

## 2020-11-04 IMAGING — CT CT ANGIO HEAD
2 of 11 series · 7 of 33 positions shown · IV contrast (OMNIPAQUE 350)
Comparison: None.

CLINICAL DATA: Headaches, left shoulder pain

EXAM:
CT ANGIOGRAPHY HEAD AND NECK
TECHNIQUE: Multidetector CT imaging of the head and neck was performed using
the standard protocol during bolus administration of intravenous
contrast. Multiplanar CT image reconstructions and MIPs were
obtained to evaluate the vascular anatomy. Carotid stenosis
measurements (when applicable) are obtained utilizing NASCET
criteria, using the distal internal carotid diameter as the
denominator.
CONTRAST:  100mL OMNIPAQUE IOHEXOL 350 MG/ML SOLN

[Series 9: cta head neck · axial · 0.43mm/px · z∈[-231,-121]mm · 2 of 166 slices shown]
[im 56/166  soft-tissue]
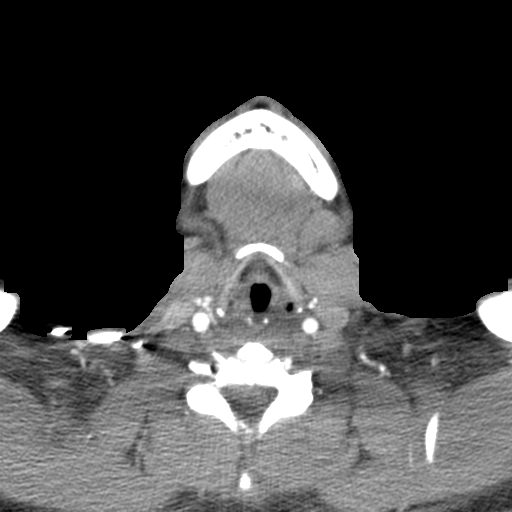
[im 111/166  soft-tissue]
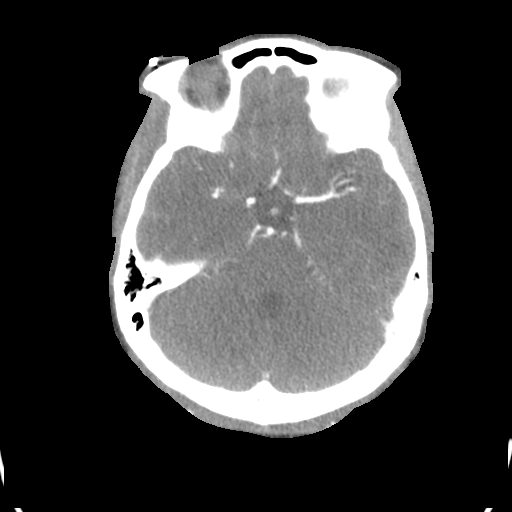

[Series 11: ax thin · axial · 0.39mm/px · z∈[-286,-66]mm · 5 of 331 slices shown]
[im 56/331  soft-tissue]
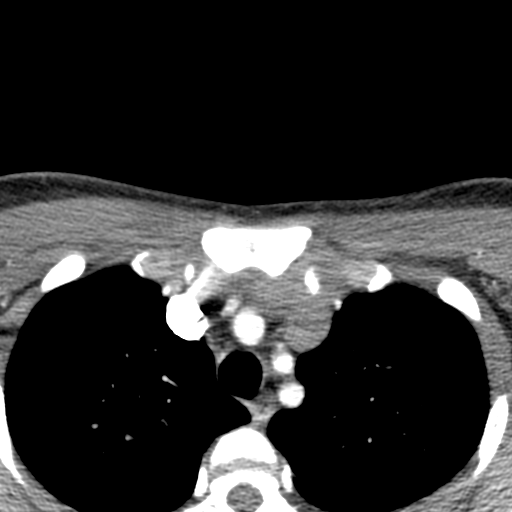
[im 111/331  bone]
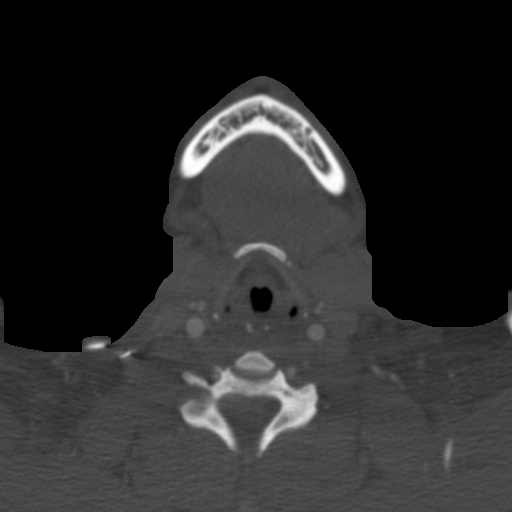
[im 166/331  soft-tissue]
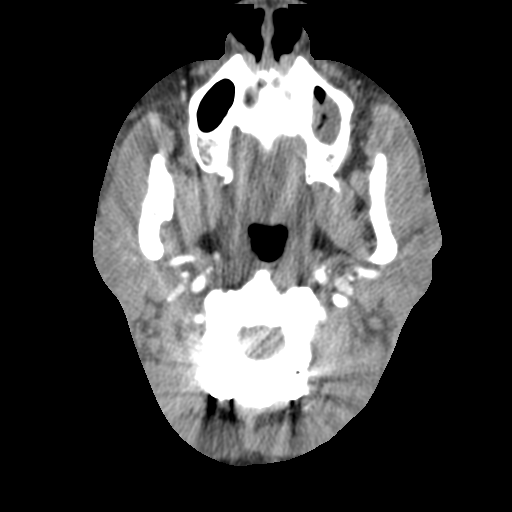
[im 221/331  bone]
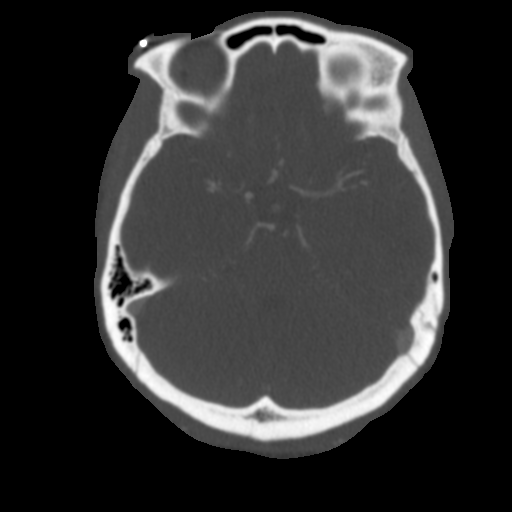
[im 276/331  soft-tissue]
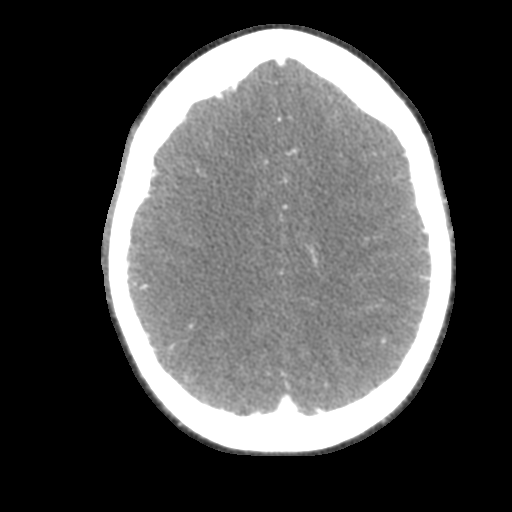

[7 of 33 positions shown; findings below may reference images not displayed]

FINDINGS: CT HEAD FINDINGS

Brain: There is no acute intracranial hemorrhage or mass effect.
Gray-white differentiation is preserved. There is ill-defined
hypoattenuation within the right thalamus (series 5, images 12 and
13). Ventricles are normal in size. There is no extra-axial fluid
collection.

Vascular: No hyperdense vessel or unexpected calcification.

Skull: Unremarkable.

Sinuses: Minor mucosal thickening.

Orbits: Unremarkable.

Review of the MIP images confirms the above findings

CTA NECK FINDINGS

Aortic arch: Great vessel origins are patent.

Right carotid system: Patent. No measurable stenosis or evidence of
dissection.

Left carotid system: Patent. No measurable stenosis or evidence of
dissection.

Vertebral arteries: Patent and codominant. No measurable stenosis or
evidence of dissection.

Skeleton: Postoperative changes of C1-C2 with solid fusion.

Other neck: No mass or adenopathy.

Upper chest: No apical lung mass.

Review of the MIP images confirms the above findings

CTA HEAD FINDINGS

Anterior circulation: Intracranial internal carotid arteries are
patent. Anterior and middle cerebral arteries are patent.

Posterior circulation: Intracranial vertebral arteries, basilar
artery, and posterior cerebral arteries are patent.

Venous sinuses: As permitted by contrast timing, patent.

Review of the MIP images confirms the above findings
IMPRESSION: No acute intracranial hemorrhage or mass effect. Ill-defined low
density within the right thalamus seen only on the noncontrast
portion. May be artifactual but could be further evaluated by MRI.

No large vessel occlusion, hemodynamically significant stenosis, or
evidence of dissection.

## 2020-11-04 IMAGING — CT CT ANGIO NECK
2 of 11 series · 7 of 33 positions shown · IV contrast (omnipaque)
Comparison: None.

CLINICAL DATA: Headaches, left shoulder pain

EXAM:
CT ANGIOGRAPHY HEAD AND NECK
TECHNIQUE: Multidetector CT imaging of the head and neck was performed using
the standard protocol during bolus administration of intravenous
contrast. Multiplanar CT image reconstructions and MIPs were
obtained to evaluate the vascular anatomy. Carotid stenosis
measurements (when applicable) are obtained utilizing NASCET
criteria, using the distal internal carotid diameter as the
denominator.
CONTRAST:  100mL OMNIPAQUE IOHEXOL 350 MG/ML SOLN

[Series 9: cta head neck · axial · 0.43mm/px · z∈[-231,-121]mm · 2 of 166 slices shown]
[im 56/166  soft-tissue]
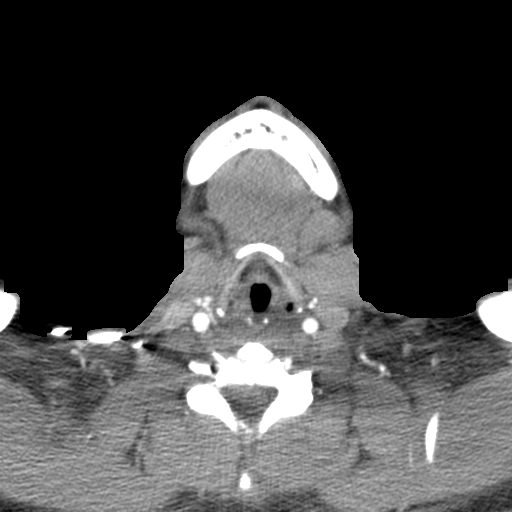
[im 111/166  soft-tissue]
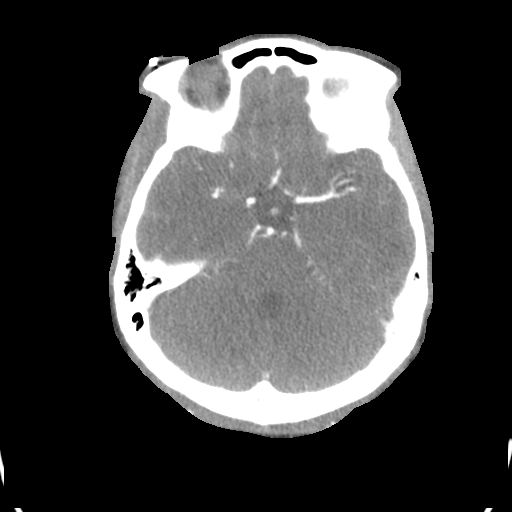

[Series 11: ax thin · axial · 0.39mm/px · z∈[-286,-66]mm · 5 of 331 slices shown]
[im 56/331  soft-tissue]
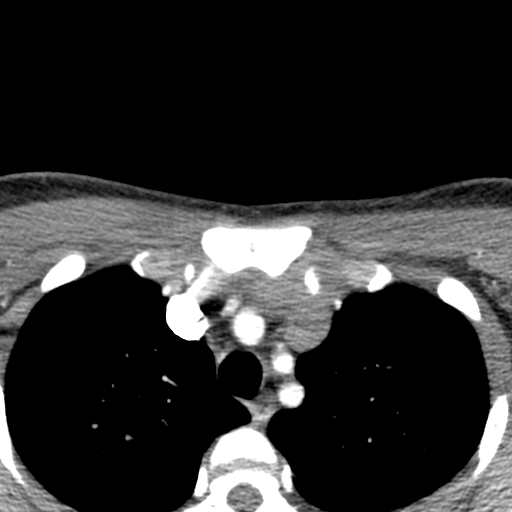
[im 111/331  bone]
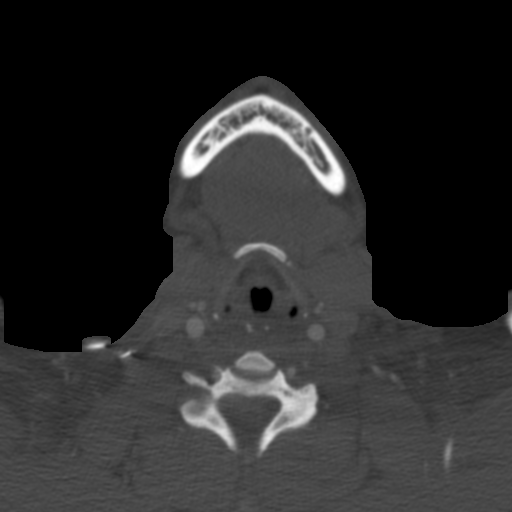
[im 166/331  soft-tissue]
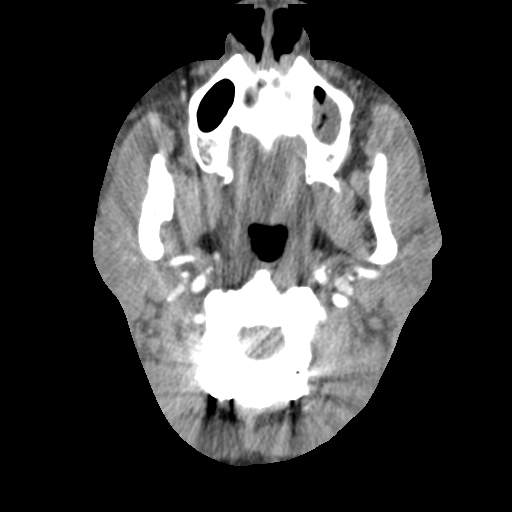
[im 221/331  bone]
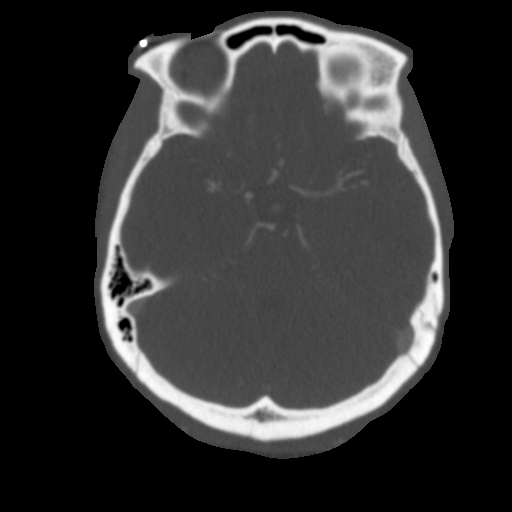
[im 276/331  soft-tissue]
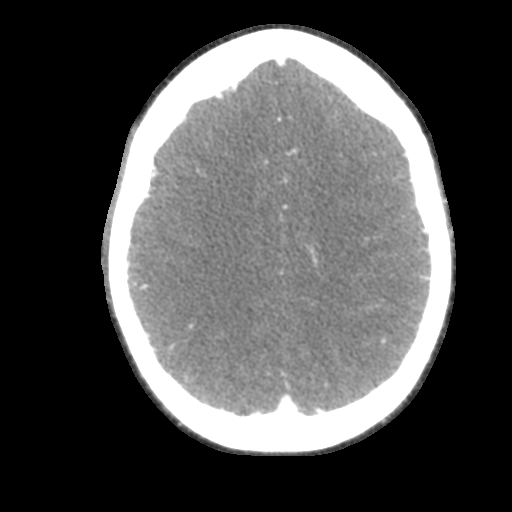

[7 of 33 positions shown; findings below may reference images not displayed]

FINDINGS: CT HEAD FINDINGS

Brain: There is no acute intracranial hemorrhage or mass effect.
Gray-white differentiation is preserved. There is ill-defined
hypoattenuation within the right thalamus (series 5, images 12 and
13). Ventricles are normal in size. There is no extra-axial fluid
collection.

Vascular: No hyperdense vessel or unexpected calcification.

Skull: Unremarkable.

Sinuses: Minor mucosal thickening.

Orbits: Unremarkable.

Review of the MIP images confirms the above findings

CTA NECK FINDINGS

Aortic arch: Great vessel origins are patent.

Right carotid system: Patent. No measurable stenosis or evidence of
dissection.

Left carotid system: Patent. No measurable stenosis or evidence of
dissection.

Vertebral arteries: Patent and codominant. No measurable stenosis or
evidence of dissection.

Skeleton: Postoperative changes of C1-C2 with solid fusion.

Other neck: No mass or adenopathy.

Upper chest: No apical lung mass.

Review of the MIP images confirms the above findings

CTA HEAD FINDINGS

Anterior circulation: Intracranial internal carotid arteries are
patent. Anterior and middle cerebral arteries are patent.

Posterior circulation: Intracranial vertebral arteries, basilar
artery, and posterior cerebral arteries are patent.

Venous sinuses: As permitted by contrast timing, patent.

Review of the MIP images confirms the above findings
IMPRESSION: No acute intracranial hemorrhage or mass effect. Ill-defined low
density within the right thalamus seen only on the noncontrast
portion. May be artifactual but could be further evaluated by MRI.

No large vessel occlusion, hemodynamically significant stenosis, or
evidence of dissection.
